# Patient Record
Sex: Female | Born: 1985 | Race: White | Hispanic: No | Marital: Married | Smoking: Never smoker
Health system: Southern US, Community
[De-identification: ages and names within clinical notes are randomized; demographics above are authoritative.]

## PROBLEM LIST (undated history)

## (undated) DIAGNOSIS — A630 Anogenital (venereal) warts: Secondary | ICD-10-CM

## (undated) DIAGNOSIS — K529 Noninfective gastroenteritis and colitis, unspecified: Secondary | ICD-10-CM

## (undated) HISTORY — DX: Anogenital (venereal) warts: A63.0

---

## 2012-03-15 DIAGNOSIS — Z3041 Encounter for surveillance of contraceptive pills: Secondary | ICD-10-CM | POA: Insufficient documentation

## 2012-03-15 DIAGNOSIS — B001 Herpesviral vesicular dermatitis: Secondary | ICD-10-CM | POA: Insufficient documentation

## 2013-02-27 DIAGNOSIS — Z8719 Personal history of other diseases of the digestive system: Secondary | ICD-10-CM | POA: Insufficient documentation

## 2013-05-27 ENCOUNTER — Encounter: Payer: Self-pay | Admitting: Emergency Medicine

## 2013-05-27 ENCOUNTER — Emergency Department
Admission: EM | Admit: 2013-05-27 | Discharge: 2013-05-27 | Disposition: A | Discharge status: Home / Self Care | Payer: 59 | Source: Home / Self Care | Attending: Emergency Medicine | Admitting: Emergency Medicine

## 2013-05-27 DIAGNOSIS — J111 Influenza due to unidentified influenza virus with other respiratory manifestations: Secondary | ICD-10-CM

## 2013-05-27 DIAGNOSIS — H6690 Otitis media, unspecified, unspecified ear: Secondary | ICD-10-CM

## 2013-05-27 DIAGNOSIS — H6691 Otitis media, unspecified, right ear: Secondary | ICD-10-CM

## 2013-05-27 DIAGNOSIS — J09X2 Influenza due to identified novel influenza A virus with other respiratory manifestations: Secondary | ICD-10-CM

## 2013-05-27 HISTORY — DX: Noninfective gastroenteritis and colitis, unspecified: K52.9

## 2013-05-27 LAB — POCT INFLUENZA A/B
Influenza A, POC: POSITIVE
Influenza B, POC: NEGATIVE

## 2013-05-27 LAB — POCT RAPID STREP A (OFFICE): Rapid Strep A Screen: NEGATIVE

## 2013-05-27 MED ORDER — AMOXICILLIN 875 MG PO TABS: 875.0000 mg | ORAL_TABLET | Freq: Two times a day (BID) | ORAL | Status: DC

## 2013-05-27 MED ORDER — OSELTAMIVIR PHOSPHATE 75 MG PO CAPS: 75.0000 mg | ORAL_CAPSULE | Freq: Two times a day (BID) | ORAL | Status: DC

## 2013-05-27 NOTE — ED Provider Notes (Signed)
CSN: [id number]     Arrival date & time 05/27/13  1323 History   First MD Initiated Contact with Patient 05/27/13 1451     Chief Complaint  Patient presents with  . Sore Throat  . Nasal Congestion  . Generalized Body Aches  . Nausea  . Otalgia  . Fever   (Consider location/radiation/quality/duration/timing/severity/associated sxs/prior Treatment) HPI Megan Willis is a 28 y.o. female who complains of onset of cold symptoms for 48+ hours.  The symptoms are constant and mild-moderate in severity. + sore throat + cough No pleuritic pain No wheezing + nasal congestion + post-nasal drainage + sinus pain/pressure No chest congestion No itchy/red eyes + earache No hemoptysis No SOB + chills/sweats + nausea No vomiting No abdominal pain No diarrhea No skin rashes + fatigue + myalgias + headache     Past Medical History  Diagnosis Date  . Colitis    History reviewed. No pertinent past surgical history. Family History  Problem Relation Age of Onset  . Hypertension Father    History  Substance Use Topics  . Smoking status: Never Smoker   . Smokeless tobacco: Not on file  . Alcohol Use: No   OB History   Grav Para Term Preterm Abortions TAB SAB Ect Mult Living                 Review of Systems  All other systems reviewed and are negative.    Allergies  Review of patient's allergies indicates no known allergies.  Home Medications   Current Outpatient Rx  Name  Route  Sig  Dispense  Refill  . norethindrone-ethinyl estradiol (TRIPHASIL,CYCLAFEM,ALYACEN) 0.5/0.75/1-35 MG-MCG tablet   Oral   Take 1 tablet by mouth daily.         Marland Kitchen amoxicillin (AMOXIL) 875 MG tablet   Oral   Take 1 tablet (875 mg total) by mouth 2 (two) times daily.   14 tablet   0   . oseltamivir (TAMIFLU) 75 MG capsule   Oral   Take 1 capsule (75 mg total) by mouth 2 (two) times daily.   10 capsule   0    BP 127/84  Pulse 114  Temp(Src) 97.5 F (36.4 C) (Oral)  Resp 16  Ht 5\' 3"   (1.6 m)  Wt 164 lb (74.39 kg)  BMI 29.06 kg/m2  SpO2 100%  LMP 05/19/2013 Physical Exam  Nursing note and vitals reviewed. Constitutional: She is oriented to person, place, and time. She appears well-developed and well-nourished.  Non-toxic appearance. She does not appear ill.  HENT:  Head: Normocephalic and atraumatic.  Right Ear: External ear and ear canal normal. A middle ear effusion is present.  Left Ear: Tympanic membrane, external ear and ear canal normal.  No middle ear effusion.  Nose: Mucosal edema and rhinorrhea present.  Mouth/Throat: Posterior oropharyngeal erythema present. No oropharyngeal exudate or posterior oropharyngeal edema.  Eyes: No scleral icterus.  Neck: Neck supple.  Cardiovascular: Regular rhythm and normal heart sounds.   Pulmonary/Chest: Effort normal and breath sounds normal. No respiratory distress. She has no decreased breath sounds. She has no wheezes.  Neurological: She is alert and oriented to person, place, and time.  Skin: Skin is warm and dry.  Psychiatric: She has a normal mood and affect. Her speech is normal.    ED Course  Procedures (including critical care time) Labs Review Labs Reviewed  STREP A DNA PROBE  POCT INFLUENZA A/B  POCT RAPID STREP A (OFFICE)   Imaging Review No results  found.   MDM   1. Influenza due to identified novel influenza A virus with other respiratory manifestations   2. Otitis media, right    1)  Take the prescribed antibiotic as instructed for AOM right ear.  Also Rx for Tamiflu if patient chooses to take, however, now at 48 hrs, can wait if she wishes since doesn't appear toxic.  Rapid strep negative.  Rapid flu + for Type A. 2)  Use nasal saline solution (over the counter) at least 3 times a day. 3)  Use over the counter decongestants like Zyrtec-D every 12 hours as needed to help with congestion.  If you have hypertension, do not take medicines with sudafed.  4)  Can take tylenol every 6 hours or motrin  every 8 hours for pain or fever. 5)  Follow up with your primary doctor if no improvement in 5-7 days, sooner if increasing pain, fever, or new symptoms.     Marlaine HindJeffrey H Ramya Vanbergen, MD 05/27/13 [phone]

## 2013-05-27 NOTE — ED Notes (Signed)
Reports 2 day history of sore throat, subjective fever, aches, congestion, cough, ear pain and mild nausea. Took Cold/Flu OTC 0700 today. 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

## 2013-12-18 ENCOUNTER — Encounter: Payer: Self-pay | Admitting: Obstetrics & Gynecology

## 2013-12-18 ENCOUNTER — Ambulatory Visit (INDEPENDENT_AMBULATORY_CARE_PROVIDER_SITE_OTHER): Payer: 59 | Admitting: Obstetrics & Gynecology

## 2013-12-18 VITALS — BP 106/71 | HR 82 | Resp 16 | Ht 64.0 in | Wt 175.0 lb

## 2013-12-18 DIAGNOSIS — A630 Anogenital (venereal) warts: Secondary | ICD-10-CM | POA: Insufficient documentation

## 2013-12-18 MED ORDER — IMIQUIMOD 5 % EX CREA: TOPICAL_CREAM | CUTANEOUS | Status: DC

## 2013-12-18 NOTE — Patient Instructions (Signed)
Genital Warts Genital warts are a sexually transmitted infection. They may appear as small bumps on the tissues of the genital area. CAUSES  Genital warts are caused by a virus called human papillomavirus (HPV). HPV is the most common sexually transmitted disease (STD) and infection of the sex organs. This infection is spread by having unprotected sex with an infected person. It can be spread by vaginal, anal, and oral sex. Many people do not know they are infected. They may be infected for years without problems. However, even if they do not have problems, they can unknowingly pass the infection to their sexual partners. SYMPTOMS   Itching and irritation in the genital area.  Warts that bleed.  Painful sexual intercourse. DIAGNOSIS  Warts are usually recognized with the naked eye on the vagina, vulva, perineum, anus, and rectum. Certain tests can also diagnose genital warts, such as:  A Pap test.  A tissue sample (biopsy) exam.  Colposcopy. A magnifying tool is used to examine the vagina and cervix. The HPV cells will change color when certain solutions are used. TREATMENT  Warts can be removed by:  Applying certain chemicals, such as cantharidin or podophyllin.  Liquid nitrogen freezing (cryotherapy).  Immunotherapy with Candida or Trichophyton injections.  Laser treatment.  Burning with an electrified probe (electrocautery).  Interferon injections.  Surgery. PREVENTION  HPV vaccination can help prevent HPV infections that cause genital warts and that cause cancer of the cervix. It is recommended that the vaccination be given to people between the ages 9 to 26 years old. The vaccine might not work as well or might not work at all if you already have HPV. It should not be given to pregnant women. HOME CARE INSTRUCTIONS   It is important to follow your caregiver's instructions. The warts will not go away without treatment. Repeat treatments are often needed to get rid of warts.  Even after it appears that the warts are gone, the normal tissue underneath often remains infected.  Do not try to treat genital warts with medicine used to treat hand warts. This type of medicine is strong and can burn the skin in the genital area, causing more damage.  Tell your past and current sexual partner(s) that you have genital warts. They may be infected also and need treatment.  Avoid sexual contact while being treated.  Do not touch or scratch the warts. The infection may spread to other parts of your body.  Women with genital warts should have a cervical cancer check (Pap test) at least once a year. This type of cancer is slow-growing and can be cured if found early. Chances of developing cervical cancer are increased with HPV.  Inform your obstetrician about your warts in the event of pregnancy. This virus can be passed to the baby's respiratory tract. Discuss this with your caregiver.  Use a condom during sexual intercourse. Following treatment, the use of condoms will help prevent reinfection.  Ask your caregiver about using over-the-counter anti-itch creams. SEEK MEDICAL CARE IF:   Your treated skin becomes red, swollen, or painful.  You have a fever.  You feel generally ill.  You feel little lumps in and around your genital area.  You are bleeding or have painful sexual intercourse. MAKE SURE YOU:   Understand these instructions.  Will watch your condition.  Will get help right away if you are not doing well or get worse. Document Released: 03/12/2000 Document Revised: 07/30/2013 Document Reviewed: 09/21/2010 ExitCare Patient Information 2015 [employer]. This   information is not intended to replace advice given to you by your health care provider. Make sure you discuss any questions you have with your health care provider.   Imiquimod skin cream What is this medicine? IMIQUIMOD (i mi KWI mod) cream is used to treat external genital or anal warts. It is  also used to treat other skin conditions such as actinic keratosis and certain types of skin cancer. This medicine may be used for other purposes; ask your health care provider or pharmacist if you have questions. COMMON BRAND NAME(S): Celesta Aver What should I tell my health care provider before I take this medicine? They need to know if you have any of these conditions: -decreased immune function -an unusual or allergic reaction to imiquimod, other medicines, foods, dyes, or preservatives -pregnant or trying to get pregnant -breast-feeding How should I use this medicine? This medicine is for external use only. Do not take by mouth. Follow the directions on the prescription label. Apply just before bedtime. Wash your hands before and after use. Apply a thin layer of cream and massage gently into the affected areas until no longer visible. Do not use in the mouth, eyes or the vagina. Use this medicine only on the affected area as directed by your health care provider. Do not use for longer than prescribed. It is important not to use more medicine than prescribed. To do so may increase the chance of side effects. Talk to your pediatrician regarding the use of this medicine in children. While this drug may be prescribed for children as young as 110 years of age for selected conditions, precautions do apply. Overdosage: If you think you have taken too much of this medicine contact a poison control center or emergency room at once. NOTE: This medicine is only for you. Do not share this medicine with others. What if I miss a dose? If you miss a dose, use it as soon as you can. If it is almost time for your next dose, use only that dose. Do not use double or extra doses. What may interact with this medicine? Interactions are not expected. Do not use any other medicines on the treated area without asking your doctor or health care professional. This list may not describe all possible interactions. Give  your health care provider a list of all the medicines, herbs, non-prescription drugs, or dietary supplements you use. Also tell them if you smoke, drink alcohol, or use illegal drugs. Some items may interact with your medicine. What should I watch for while using this medicine? Visit your health care professional for regular checks on your progress. Do not use this medicine until the skin has healed from any other drug (example: podofilox or podophyllin resin) or surgical skin treatment. Females should receive regular pelvic exams while being treated for genital warts. Most patients see improvement within 4 weeks. It may take up to 16 weeks to see a full clearing of the warts. This medicine is not a cure. New warts may develop during or after treatment. Avoid sexual (genital, anal, oral) contact while the cream is on the skin. If warts are visible in the genital area, sexual contact should be avoided until the warts are treated. The use of latex condoms during sexual contact may reduce, but not entirely prevent, infecting others. This medicine may weaken condoms, diaphragms, cervical caps or other barrier devices and make them less effective as birth control. Do not cover the treated area with an airtight bandage. Cotton gauze  dressings can be used. Cotton underwear can be worn after using this medicine on the genital or anal area. Actinic keratoses that were not seen before may appear during treatment and may later go away. The treatment area and surrounding area may lighten or darken after treatment with this medicine. These skin color changes may be permanent in some patients. If you experience a skin reaction at the treatment site that interferes or prevents you from doing any daily activity, contact your health care provider. You may need a rest period from treatment. Treatment may be restarted once the reaction has gotten better as recommended by your doctor or health care professional. This medicine  can make you more sensitive to the sun. Keep out of the sun. If you cannot avoid being in the sun, wear protective clothing and use sunscreen. Do not use sun lamps or tanning beds/booths. What side effects may I notice from receiving this medicine? Side effects that you should report to your doctor or health care professional as soon as possible: -open sores with or without drainage -skin infection -skin rash -unusual or severe skin reaction Side effects that usually do not require medical attention (report to your doctor or health care professional if they continue or are bothersome): -burning or itching -redness of the skin (very common but is usually not painful or harmful) -scabbing, crusting, or peeling skin -skin that becomes hard or thickened -swelling of the skin This list may not describe all possible side effects. Call your doctor for medical advice about side effects. You may report side effects to FDA at 1-800-FDA-1088. Where should I keep my medicine? Keep out of the reach of children. Store between 4 and 25 degrees C (39 and 77 degrees F). Do not freeze. Throw away any unused medicine after the expiration date. Discard packet after applying to affected area. Partial packets should not be saved or reused. NOTE: This sheet is a summary. It may not cover all possible information. If you have questions about this medicine, talk to your doctor, pharmacist, or health care provider.  2015, Elsevier/Gold Standard. (2008-02-27 10:33:25)

## 2013-12-18 NOTE — Progress Notes (Signed)
   CLINIC ENCOUNTER NOTE  History:  28 y.o. G0 here today for evaluation and management of genital warts. She has received Gardasil series.   The following portions of the patient's history were reviewed and updated as appropriate: allergies, current medications, past family history, past medical history, past social history, past surgical history and problem list. She had a normal pap smear 1.5 years ago, no history of cervical dysplasia.  Review of Systems:  Pertinent items are noted in HPI.  Objective:  Physical Exam BP 106/71  Pulse 82  Resp 16  Ht  (1.626 m)  Wt 175 lb (79.379 kg)  BMI 30.02 kg/m2  LMP 11/26/2013 Gen: NAD Abd: Soft, nontender and nondistended Pelvic: One 5 mm raised lesion noted in the posterior fourchette, some flat lesions also noted.   No erythema.   Normal appearing external genitalia; normal appearing vaginal mucosa and cervix.  Normal discharge.  Small uterus, no other palpable masses, no uterine or adnexal tenderness   Assessment & Plan:  Counseled about different options for treatment: TCA, Aldara, cryotherapy.  Details of these modalities discussed; all questions answered. Patient desires Aldara, this was prescribed for patient. Will monitor response.  Routine preventative health maintenance measures emphasized.    Jaynie Collins, MD, FACOG Attending Obstetrician & Gynecologist Center for [employer], [facility]

## 2015-07-28 ENCOUNTER — Encounter: Payer: Self-pay | Admitting: Sports Medicine

## 2015-07-28 ENCOUNTER — Ambulatory Visit (INDEPENDENT_AMBULATORY_CARE_PROVIDER_SITE_OTHER): Payer: BLUE CROSS/BLUE SHIELD | Admitting: Sports Medicine

## 2015-07-28 VITALS — BP 102/63 | HR 81 | Resp 18 | Wt 207.0 lb

## 2015-07-28 DIAGNOSIS — M722 Plantar fascial fibromatosis: Secondary | ICD-10-CM | POA: Diagnosis not present

## 2015-07-28 MED ORDER — MELOXICAM 15 MG PO TABS: ORAL_TABLET | ORAL | Status: DC

## 2015-07-28 NOTE — Progress Notes (Signed)
   Subjective:    I'm seeing this patient as a consultation for:   Dr. Asencion Partridgeamille Andy  CC:  Bilateral heel pain  HPI: For months this pleasant 30 year old female  Has had pain that she localizes on the plantar aspect of both heels, left worse than right, worse with the first few steps in the morning. Pain is moderate, persistent without radiation.  Past medical history, Surgical history, Family history not pertinant except as noted below, Social history, Allergies, and medications have been entered into the medical record, reviewed, and no changes needed.   Review of Systems: No headache, visual changes, nausea, vomiting, diarrhea, constipation, dizziness, abdominal pain, skin rash, fevers, chills, night sweats, weight loss, swollen lymph nodes, body aches, joint swelling, muscle aches, chest pain, shortness of breath, mood changes, visual or auditory hallucinations.   Objective:   General: Well Developed, well nourished, and in no acute distress.  Neuro/Psych: Alert and oriented x3, extra-ocular muscles intact, able to move all 4 extremities, sensation grossly intact. Skin: Warm and dry, no rashes noted.  Respiratory: Not using accessory muscles, speaking in full sentences, trachea midline.  Cardiovascular: Pulses palpable, no extremity edema. Abdomen: Does not appear distended.  Bilateral feet: No visible erythema or swelling. Range of motion is full in all directions. Strength is 5/5 in all directions. No hallux valgus. No pes cavus or pes planus. No abnormal callus noted. No pain over the navicular prominence, or base of fifth metatarsal.  mild tenderness to palpation of the calcaneal insertion of plantar fascia. No pain at the Achilles insertion. No pain over the calcaneal bursa. No pain of the retrocalcaneal bursa. No tenderness to palpation over the tarsals, metatarsals, or phalanges. No hallux rigidus or limitus. No tenderness palpation over interphalangeal joints. No pain  with compression of the metatarsal heads. Neurovascularly intact distally.  Impression and Recommendations:   This case required medical decision making of moderate complexity.

## 2015-07-28 NOTE — Assessment & Plan Note (Signed)
Return for custom orthotics, adding meloxicam, rehabilitation exercises, avoidance of barefoot walking.

## 2015-08-05 ENCOUNTER — Ambulatory Visit (INDEPENDENT_AMBULATORY_CARE_PROVIDER_SITE_OTHER): Payer: BLUE CROSS/BLUE SHIELD | Admitting: Sports Medicine

## 2015-08-05 ENCOUNTER — Encounter: Payer: Self-pay | Admitting: Sports Medicine

## 2015-08-05 VITALS — BP 110/73 | HR 101 | Resp 18 | Wt 210.5 lb

## 2015-08-05 DIAGNOSIS — M722 Plantar fascial fibromatosis: Secondary | ICD-10-CM

## 2015-08-05 NOTE — Progress Notes (Signed)

## 2015-08-05 NOTE — Assessment & Plan Note (Signed)
Custom orthotics as above, return in one month. 

## 2015-09-02 ENCOUNTER — Ambulatory Visit (INDEPENDENT_AMBULATORY_CARE_PROVIDER_SITE_OTHER): Payer: BLUE CROSS/BLUE SHIELD | Admitting: Sports Medicine

## 2015-09-02 ENCOUNTER — Encounter: Payer: Self-pay | Admitting: Sports Medicine

## 2015-09-02 VITALS — BP 122/88 | HR 96 | Resp 18 | Wt 208.1 lb

## 2015-09-02 DIAGNOSIS — M722 Plantar fascial fibromatosis: Secondary | ICD-10-CM

## 2015-09-02 NOTE — Assessment & Plan Note (Signed)
Resolved with conservative measures, return as needed. 

## 2015-09-02 NOTE — Progress Notes (Signed)
  Subjective:    CC: Follow-up  HPI: Plantar fasciitis: Resolved with orthotics and rehabilitation exercises and meloxicam.  Past medical history, Surgical history, Family history not pertinant except as noted below, Social history, Allergies, and medications have been entered into the medical record, reviewed, and no changes needed.   Review of Systems: No fevers, chills, night sweats, weight loss, chest pain, or shortness of breath.   Objective:    General: Well Developed, well nourished, and in no acute distress.  Neuro: Alert and oriented x3, extra-ocular muscles intact, sensation grossly intact.  HEENT: Normocephalic, atraumatic, pupils equal round reactive to light, neck supple, no masses, no lymphadenopathy, thyroid nonpalpable.  Skin: Warm and dry, no rashes. Cardiac: Regular rate and rhythm, no murmurs rubs or gallops, no lower extremity edema.  Respiratory: Clear to auscultation bilaterally. Not using accessory muscles, speaking in full sentences.  Impression and Recommendations:

## 2015-09-18 DIAGNOSIS — M545 Low back pain, unspecified: Secondary | ICD-10-CM | POA: Insufficient documentation

## 2015-09-18 DIAGNOSIS — G8929 Other chronic pain: Secondary | ICD-10-CM | POA: Insufficient documentation

## 2018-07-19 ENCOUNTER — Ambulatory Visit (INDEPENDENT_AMBULATORY_CARE_PROVIDER_SITE_OTHER): Payer: 59 | Admitting: Sports Medicine

## 2018-07-19 ENCOUNTER — Encounter: Payer: Self-pay | Admitting: Sports Medicine

## 2018-07-19 DIAGNOSIS — S86892A Other injury of other muscle(s) and tendon(s) at lower leg level, left leg, initial encounter: Secondary | ICD-10-CM

## 2018-07-19 DIAGNOSIS — M2580 Other specified joint disorders, unspecified joint: Secondary | ICD-10-CM | POA: Diagnosis not present

## 2018-07-19 DIAGNOSIS — M7061 Trochanteric bursitis, right hip: Secondary | ICD-10-CM

## 2018-07-19 MED ORDER — CALCIUM CARBONATE-VITAMIN D 600-400 MG-UNIT PO TABS: 1.0000 | ORAL_TABLET | Freq: Two times a day (BID) | ORAL | 11 refills | Status: DC

## 2018-07-19 MED ORDER — MELOXICAM 15 MG PO TABS: ORAL_TABLET | ORAL | 3 refills | Status: DC

## 2018-07-19 NOTE — Assessment & Plan Note (Signed)
Meloxicam, dancers pad placed in right orthotic. Rehab exercises given. She will cut mileage in half for a week and then restart. Return to see me in 1 month.

## 2018-07-19 NOTE — Assessment & Plan Note (Signed)
Nexplanon in place, painful, desires removal. This was removed today, closed with Dermabond, she can return to see me as needed, she will also touch base with her PCP to discuss another form of contraception. I really do not think she is getting need any prescription strength postoperative pain relief, she can use ibuprofen or Tylenol.

## 2018-07-19 NOTE — Assessment & Plan Note (Signed)
Weak hip abductors, exercises given to strengthen this. Adding meloxicam and a calcium/vitamin D supplement. Return to see me in 1 month, we will retest hip abductor strength.

## 2018-07-19 NOTE — Patient Instructions (Signed)
Medial tibial stress syndrome, sesamoiditis, and greater trochanteric bursitis. Dancers pad placed in the right orthotic. Adding meloxicam daily to calm things down, as well as a calcium and vitamin D supplement to use twice a day. Cut mileage in half for 1 week and then may increase mileage again after that.  Hip Rehabilitation Protocol:  1.  Side leg raises.  3x30 with no weight, then 3x15 with 2 lb ankle weight, then 3x15 with 5 lb ankle weight 2.  Standing hip rotation.  3x30 with no weight, then 3x15 with 2 lb ankle weight, then 3x15 with 5 lb ankle weight. 3.  Side step ups.  3x30 with no weight, then 3x15 with 5 lbs in backpack, then 3x15 with 10 lbs in backpack.

## 2018-07-19 NOTE — Progress Notes (Signed)
Subjective:    CC: Multiple pains  HPI: This is a pleasant 33 year old female, she has recently started running for exercise and to lose weight.  Currently doing a couch to 5K program and at about 15 miles per week.  She did recently switch to new shoes.  Unfortunately she has started to develop pain in the lateral right hip, anteromedial left shin worse than right shin, as well as on the plantar aspect of the right first MTP.  Symptoms are moderate, worsening, localized without radiation.  No trauma.  No constitutional symptoms.  I reviewed the past medical history, family history, social history, surgical history, and allergies today and no changes were needed.  Please see the problem list section below in epic for further details.  Past Medical History: Past Medical History:  Diagnosis Date  . Colitis   . Genital warts    Past Surgical History: No past surgical history on file. Social History: Social History   Socioeconomic History  . Marital status: Single    Spouse name: Not on file  . Number of children: Not on file  . Years of education: Not on file  . Highest education level: Not on file  Occupational History  . Not on file  Social Needs  . Financial resource strain: Not on file  . Food insecurity:    Worry: Not on file    Inability: Not on file  . Transportation needs:    Medical: Not on file    Non-medical: Not on file  Tobacco Use  . Smoking status: Never Smoker  . Smokeless tobacco: Never Used  Substance and Sexual Activity  . Alcohol use: No  . Drug use: No  . Sexual activity: Yes    Partners: Male    Birth control/protection: Pill  Lifestyle  . Physical activity:    Days per week: Not on file    Minutes per session: Not on file  . Stress: Not on file  Relationships  . Social connections:    Talks on phone: Not on file    Gets together: Not on file    Attends religious service: Not on file    Active member of club or organization: Not on file   Attends meetings of clubs or organizations: Not on file    Relationship status: Not on file  Other Topics Concern  . Not on file  Social History Narrative  . Not on file   Family History: Family History  Problem Relation Age of Onset  . Hypertension Father   . Stroke Maternal Grandfather   . Heart disease Maternal Grandfather    Allergies: No Known Allergies Medications: See med rec.  Review of Systems: No fevers, chills, night sweats, weight loss, chest pain, or shortness of breath.   Objective:    General: Well Developed, well nourished, and in no acute distress.  Neuro: Alert and oriented x3, extra-ocular muscles intact, sensation grossly intact.  HEENT: Normocephalic, atraumatic, pupils equal round reactive to light, neck supple, no masses, no lymphadenopathy, thyroid nonpalpable.  Skin: Warm and dry, no rashes. Cardiac: Regular rate and rhythm, no murmurs rubs or gallops, no lower extremity edema.  Respiratory: Clear to auscultation bilaterally. Not using accessory muscles, speaking in full sentences. Right foot: No visible erythema or swelling. Range of motion is full in all directions. Strength is 5/5 in all directions. No hallux valgus. No pes cavus or pes planus. No abnormal callus noted. No pain over the navicular prominence, or base of fifth metatarsal.  No tenderness to palpation of the calcaneal insertion of plantar fascia. No pain at the Achilles insertion. No pain over the calcaneal bursa. No pain of the retrocalcaneal bursa. No tenderness to palpation over the tarsals, metatarsals, or phalanges. No hallux rigidus or limitus. No tenderness palpation over interphalangeal joints. No pain with compression of the metatarsal heads. Neurovascularly intact distally. Tender to palpation along the medial sesamoid. Left shin: There is a broad-based area of tenderness to palpation along the medial border, no palpable bony callus or discrete areas of tenderness.  Right hip: ROM IR: 60 Deg, ER: 60 Deg, Flexion: 120 Deg, Extension: 100 Deg, Abduction: 45 Deg, Adduction: 45 Deg Strength IR: 5/5, ER: 5/5, Flexion: 5/5, Extension: 5/5, Abduction: 5/5, Adduction: 5/5 Pelvic alignment unremarkable to inspection and palpation. Standing hip rotation and gait without trendelenburg / unsteadiness. Greater trochanter with tenderness to palpation. No tenderness over piriformis. No SI joint tenderness and normal minimal SI movement. Hip abductor strength is objectively weak bilaterally.  Impression and Recommendations:    Left medial tibial stress syndrome (Shin Splints) Weak hip abductors, exercises given to strengthen this. Adding meloxicam and a calcium/vitamin D supplement. Return to see me in 1 month, we will retest hip abductor strength.  Trochanteric bursitis, right hip Weak hip abductors, exercises given to strengthen this. Adding meloxicam and a calcium/vitamin D supplement. Return to see me in 1 month, we will retest hip abductor strength.  Sesamoiditis Meloxicam, dancers pad placed in right orthotic. Rehab exercises given. She will cut mileage in half for a week and then restart. Return to see me in 1 month.  ___________________________________________ Ihor Austinhomas J. Benjamin Stainhekkekandam, M.D., ABFM., CAQSM. Primary Care and Sports Medicine [facility]  Adjunct Professor of Family Medicine  [employer]

## 2018-07-19 NOTE — Assessment & Plan Note (Signed)
Weak hip abductors, exercises given to strengthen this. Adding meloxicam and a calcium/vitamin D supplement. Return to see me in 1 month, we will retest hip abductor strength. 

## 2018-08-16 ENCOUNTER — Encounter: Payer: Self-pay | Admitting: Sports Medicine

## 2018-08-16 ENCOUNTER — Ambulatory Visit (INDEPENDENT_AMBULATORY_CARE_PROVIDER_SITE_OTHER): Payer: 59 | Admitting: Sports Medicine

## 2018-08-16 DIAGNOSIS — S86892D Other injury of other muscle(s) and tendon(s) at lower leg level, left leg, subsequent encounter: Secondary | ICD-10-CM

## 2018-08-16 DIAGNOSIS — J301 Allergic rhinitis due to pollen: Secondary | ICD-10-CM

## 2018-08-16 DIAGNOSIS — M722 Plantar fascial fibromatosis: Secondary | ICD-10-CM | POA: Diagnosis not present

## 2018-08-16 DIAGNOSIS — M7061 Trochanteric bursitis, right hip: Secondary | ICD-10-CM

## 2018-08-16 DIAGNOSIS — J309 Allergic rhinitis, unspecified: Secondary | ICD-10-CM | POA: Insufficient documentation

## 2018-08-16 MED ORDER — LEVOCETIRIZINE DIHYDROCHLORIDE 5 MG PO TABS
5.0000 mg | ORAL_TABLET | Freq: Every evening | ORAL | 3 refills | Status: DC
Start: 2018-08-16 — End: 2018-09-13

## 2018-08-16 MED ORDER — TRIAMCINOLONE ACETONIDE 55 MCG/ACT NA AERO: 2.0000 | INHALATION_SPRAY | Freq: Every day | NASAL | 11 refills | Status: AC

## 2018-08-16 NOTE — Progress Notes (Signed)
Subjective:    CC: Multiple issues  HPI: This is a pleasant 33 year old female, we treated her for medial tibial stress syndrome, trochanteric bursitis, sesamoiditis, her hip abductor's were very weak on the right, she has been working to strengthen them and her symptoms have improved to some degree, she still has significant discomfort when going to 21+ minutes of running, this is after coming off of the meloxicam.  She feels as though she may have advanced her distance a bit too quickly.  In addition she has seasonal allergies, intolerable runny nose, congestion, did not respond to Allegra, Zyrtec, Claritin, nasal azelastine.  I reviewed the past medical history, family history, social history, surgical history, and allergies today and no changes were needed.  Please see the problem list section below in epic for further details.  Past Medical History: Past Medical History:  Diagnosis Date  . Colitis   . Genital warts    Past Surgical History: No past surgical history on file. Social History: Social History   Socioeconomic History  . Marital status: Single    Spouse name: Not on file  . Number of children: Not on file  . Years of education: Not on file  . Highest education level: Not on file  Occupational History  . Not on file  Social Needs  . Financial resource strain: Not on file  . Food insecurity:    Worry: Not on file    Inability: Not on file  . Transportation needs:    Medical: Not on file    Non-medical: Not on file  Tobacco Use  . Smoking status: Never Smoker  . Smokeless tobacco: Never Used  Substance and Sexual Activity  . Alcohol use: No  . Drug use: No  . Sexual activity: Yes    Partners: Male    Birth control/protection: Pill  Lifestyle  . Physical activity:    Days per week: Not on file    Minutes per session: Not on file  . Stress: Not on file  Relationships  . Social connections:    Talks on phone: Not on file    Gets together: Not on file     Attends religious service: Not on file    Active member of club or organization: Not on file    Attends meetings of clubs or organizations: Not on file    Relationship status: Not on file  Other Topics Concern  . Not on file  Social History Narrative  . Not on file   Family History: Family History  Problem Relation Age of Onset  . Hypertension Father   . Stroke Maternal Grandfather   . Heart disease Maternal Grandfather    Allergies: No Known Allergies Medications: See med rec.  Review of Systems: No fevers, chills, night sweats, weight loss, chest pain, or shortness of breath.   Objective:    General: Well Developed, well nourished, and in no acute distress.  Neuro: Alert and oriented x3, extra-ocular muscles intact, sensation grossly intact.  HEENT: Normocephalic, atraumatic, pupils equal round reactive to light, neck supple, no masses, no lymphadenopathy, thyroid nonpalpable.  Skin: Warm and dry, no rashes. Cardiac: Regular rate and rhythm, no murmurs rubs or gallops, no lower extremity edema.  Respiratory: Clear to auscultation bilaterally. Not using accessory muscles, speaking in full sentences. Right hip: Hip abductor's are improved but still weak compared to the left.  Impression and Recommendations:    Left medial tibial stress syndrome (Shin Splints) Having good improvements, does need to work  a little more in the rehab exercises, she is not on the hip abductor exercises with a weight just yet. She will also restart the meloxicam and do a slower up titration of her distance. Return in a month.  Plantar fasciitis, bilateral Having good improvements, does need to work a little more in the rehab exercises, she is not on the hip abductor exercises with a weight just yet. She will also restart the meloxicam and do a slower up titration of her distance. Return in a month.  Trochanteric bursitis, right hip Having good improvements, does need to work a little more in  the rehab exercises, she is not on the hip abductor exercises with a weight just yet. She will also restart the meloxicam and do a slower up titration of her distance. Return in a month.  Allergic rhinitis Has not had success with Claritin, Allegra, Zyrtec. Nasal azelastine was also not very effective. Switching to Nasacort, Xyzal. Return in a month for this.   ___________________________________________ Ihor Austin. Benjamin Stain, M.D., ABFM., CAQSM. Primary Care and Sports Medicine [facility]  Adjunct Professor of Family Medicine  [employer]

## 2018-08-16 NOTE — Assessment & Plan Note (Signed)
Having good improvements, does need to work a little more in the rehab exercises, she is not on the hip abductor exercises with a weight just yet. She will also restart the meloxicam and do a slower up titration of her distance. Return in a month.

## 2018-08-16 NOTE — Assessment & Plan Note (Signed)
Having good improvements, does need to work a little more in the rehab exercises, she is not on the hip abductor exercises with a weight just yet. She will also restart the meloxicam and do a slower up titration of her distance. Return in a month. 

## 2018-08-16 NOTE — Assessment & Plan Note (Signed)
Has not had success with Claritin, Allegra, Zyrtec. Nasal azelastine was also not very effective. Switching to Nasacort, Xyzal. Return in a month for this.

## 2018-09-13 ENCOUNTER — Other Ambulatory Visit: Payer: Self-pay

## 2018-09-13 ENCOUNTER — Ambulatory Visit (INDEPENDENT_AMBULATORY_CARE_PROVIDER_SITE_OTHER): Payer: 59

## 2018-09-13 ENCOUNTER — Encounter: Payer: Self-pay | Admitting: Sports Medicine

## 2018-09-13 ENCOUNTER — Ambulatory Visit (INDEPENDENT_AMBULATORY_CARE_PROVIDER_SITE_OTHER): Payer: 59 | Admitting: Sports Medicine

## 2018-09-13 DIAGNOSIS — M7061 Trochanteric bursitis, right hip: Secondary | ICD-10-CM | POA: Diagnosis not present

## 2018-09-13 DIAGNOSIS — S86892D Other injury of other muscle(s) and tendon(s) at lower leg level, left leg, subsequent encounter: Secondary | ICD-10-CM | POA: Diagnosis not present

## 2018-09-13 DIAGNOSIS — M79674 Pain in right toe(s): Secondary | ICD-10-CM

## 2018-09-13 NOTE — Progress Notes (Signed)
Subjective:    CC: Follow-up  HPI: Megan Willis is a pleasant 33 year old female, we have been treating her for trochanteric bursitis, medial tibial stress syndrome, and sesamoiditis.  Her hip abductor strength improved with home rehab exercises and her MTSS and trochanteric bursitis resolved, continues to have pain along the first ray, initially at the medial sesamoid but now radiating along the dorsomedial first MTP.  Mild, persistent, localized without radiation proximally.  I reviewed the past medical history, family history, social history, surgical history, and allergies today and no changes were needed.  Please see the problem list section below in epic for further details.  Past Medical History: Past Medical History:  Diagnosis Date  . Colitis   . Genital warts    Past Surgical History: No past surgical history on file. Social History: Social History   Socioeconomic History  . Marital status: Single    Spouse name: Not on file  . Number of children: Not on file  . Years of education: Not on file  . Highest education level: Not on file  Occupational History  . Not on file  Social Needs  . Financial resource strain: Not on file  . Food insecurity    Worry: Not on file    Inability: Not on file  . Transportation needs    Medical: Not on file    Non-medical: Not on file  Tobacco Use  . Smoking status: Never Smoker  . Smokeless tobacco: Never Used  Substance and Sexual Activity  . Alcohol use: No  . Drug use: No  . Sexual activity: Yes    Partners: Male    Birth control/protection: Pill  Lifestyle  . Physical activity    Days per week: Not on file    Minutes per session: Not on file  . Stress: Not on file  Relationships  . Social Herbalist on phone: Not on file    Gets together: Not on file    Attends religious service: Not on file    Active member of club or organization: Not on file    Attends meetings of clubs or organizations: Not on file   Relationship status: Not on file  Other Topics Concern  . Not on file  Social History Narrative  . Not on file   Family History: Family History  Problem Relation Age of Onset  . Hypertension Father   . Stroke Maternal Grandfather   . Heart disease Maternal Grandfather    Allergies: No Known Allergies Medications: See med rec.  Review of Systems: No fevers, chills, night sweats, weight loss, chest pain, or shortness of breath.   Objective:    General: Well Developed, well nourished, and in no acute distress.  Neuro: Alert and oriented x3, extra-ocular muscles intact, sensation grossly intact.  HEENT: Normocephalic, atraumatic, pupils equal round reactive to light, neck supple, no masses, no lymphadenopathy, thyroid nonpalpable.  Skin: Warm and dry, no rashes. Cardiac: Regular rate and rhythm, no murmurs rubs or gallops, no lower extremity edema.  Respiratory: Clear to auscultation bilaterally. Not using accessory muscles, speaking in full sentences. Right foot: No visible erythema or swelling. Range of motion is full in all directions. Strength is 5/5 in all directions. No hallux valgus. No pes cavus or pes planus. No abnormal callus noted. No pain over the navicular prominence, or base of fifth metatarsal. No tenderness to palpation of the calcaneal insertion of plantar fascia. No pain at the Achilles insertion. No pain over the calcaneal bursa.  No pain of the retrocalcaneal bursa. No tenderness to palpation over the tarsals, metatarsals, or phalanges. No hallux rigidus or limitus. No tenderness palpation over interphalangeal joints. No pain with compression of the metatarsal heads. Neurovascularly intact distally. Hip abductors now strong and symmetric.  Impression and Recommendations:    Pain of great toe, right Initially suspected to be medial sesamoiditis. No better with dancers pad. We switched to a first metatarsal ray post, and she will see how this goes over  the next month. She can follow-up in a virtual visit.  Left medial tibial stress syndrome (Shin Splints) Resolved with hip abductor strengthening.  Trochanteric bursitis, right hip Resolved with hip abductor strengthening, strength is now symmetric.   ___________________________________________ Ihor Austinhomas J. Benjamin Stainhekkekandam, M.D., ABFM., CAQSM. Primary Care and Sports Medicine [facility]  Adjunct Professor of Family Medicine  [employer]

## 2018-09-13 NOTE — Assessment & Plan Note (Signed)
Resolved with hip abductor strengthening, strength is now symmetric.

## 2018-09-13 NOTE — Assessment & Plan Note (Signed)
Resolved with hip abductor strengthening.

## 2018-09-13 NOTE — Assessment & Plan Note (Signed)
Initially suspected to be medial sesamoiditis. No better with dancers pad. We switched to a first metatarsal ray post, and she will see how this goes over the next month. She can follow-up in a virtual visit.

## 2018-09-26 ENCOUNTER — Encounter: Payer: Self-pay | Admitting: Sports Medicine

## 2018-10-19 ENCOUNTER — Ambulatory Visit (INDEPENDENT_AMBULATORY_CARE_PROVIDER_SITE_OTHER): Payer: 59 | Admitting: Sports Medicine

## 2018-10-19 ENCOUNTER — Encounter: Payer: Self-pay | Admitting: Sports Medicine

## 2018-10-19 ENCOUNTER — Telehealth: Payer: Self-pay | Admitting: Sports Medicine

## 2018-10-19 DIAGNOSIS — M79674 Pain in right toe(s): Secondary | ICD-10-CM

## 2018-10-19 NOTE — Telephone Encounter (Signed)
Called [employer] for prior auth of MR right foot without contrast. Case # [id number]. Prior auth obtained with authorization number as follows [id number] good from 11/19/18 to 04/17/19. Herbie Drape at [facility] in [place] and given Josem Kaufmann #. Place on Vivia Ewing desk to enter into work que. KG LPN

## 2018-10-19 NOTE — Assessment & Plan Note (Signed)
Continues to have pain on the plantar great toe, initially suspected to be medial sesamoiditis, did not get better with dancers pads, first metatarsal ray posting, at this point I think we need an MRI, she has failed greater than 6 weeks of physician directed conservative measures with unrevealing x-rays.

## 2018-10-19 NOTE — Progress Notes (Signed)
Virtual Visit via WebEx/MyChart   I connected with  Megan Willis  on 10/19/18 via WebEx/MyChart/Doximity Video and verified that I am speaking with the correct person using two identifiers.   I discussed the limitations, risks, security and privacy concerns of performing an evaluation and management service by WebEx/MyChart/Doximity Video, including the higher likelihood of inaccurate diagnosis and treatment, and the availability of in person appointments.  We also discussed the likely need of an additional face to face encounter for complete and high quality delivery of care.  I also discussed with the patient that there may be a patient responsible charge related to this service. The patient expressed understanding and wishes to proceed.  Provider location is either at home or medical facility. Patient location is at their home, different from provider location. People involved in care of the patient during this telehealth encounter were myself, my nurse/medical assistant, and my front office/scheduling team member.  Subjective:    CC: Follow-up  HPI: This is a pleasant 33 year old female, we have been treating her for a sesamoiditis, we have tried dancers pads, metatarsal pads, activity modification, custom orthotics without success.  Symptoms are moderate, persistent.  X-rays were unrevealing, she has failed greater than 6 weeks of physician directed conservative measures.  I reviewed the past medical history, family history, social history, surgical history, and allergies today and no changes were needed.  Please see the problem list section below in epic for further details.  Past Medical History: Past Medical History:  Diagnosis Date  . Colitis   . Genital warts    Past Surgical History: No past surgical history on file. Social History: Social History   Socioeconomic History  . Marital status: Single    Spouse name: Not on file  . Number of children: Not on file  . Years  of education: Not on file  . Highest education level: Not on file  Occupational History  . Not on file  Social Needs  . Financial resource strain: Not on file  . Food insecurity    Worry: Not on file    Inability: Not on file  . Transportation needs    Medical: Not on file    Non-medical: Not on file  Tobacco Use  . Smoking status: Never Smoker  . Smokeless tobacco: Never Used  Substance and Sexual Activity  . Alcohol use: No  . Drug use: No  . Sexual activity: Yes    Partners: Male    Birth control/protection: Pill  Lifestyle  . Physical activity    Days per week: Not on file    Minutes per session: Not on file  . Stress: Not on file  Relationships  . Social Herbalist on phone: Not on file    Gets together: Not on file    Attends religious service: Not on file    Active member of club or organization: Not on file    Attends meetings of clubs or organizations: Not on file    Relationship status: Not on file  Other Topics Concern  . Not on file  Social History Narrative  . Not on file   Family History: Family History  Problem Relation Age of Onset  . Hypertension Father   . Stroke Maternal Grandfather   . Heart disease Maternal Grandfather    Allergies: No Known Allergies Medications: See med rec.  Review of Systems: No fevers, chills, night sweats, weight loss, chest pain, or shortness of breath.   Objective:  General: Speaking full sentences, no audible heavy breathing.  Sounds alert and appropriately interactive.  Appears well.  Face symmetric.  Extraocular movements intact.  Pupils equal and round.  No nasal flaring or accessory muscle use visualized.  No other physical exam performed due to the non-physical nature of this visit.  Impression and Recommendations:    Pain of great toe, right Continues to have pain on the plantar great toe, initially suspected to be medial sesamoiditis, did not get better with dancers pads, first metatarsal ray  posting, at this point I think we need an MRI, she has failed greater than 6 weeks of physician directed conservative measures with unrevealing x-rays.  I discussed the above assessment and treatment plan with the patient. The patient was provided an opportunity to ask questions and all were answered. The patient agreed with the plan and demonstrated an understanding of the instructions.   The patient was advised to call back or seek an in-person evaluation if the symptoms worsen or if the condition fails to improve as anticipated.   I provided 25 minutes of non-face-to-face time during this encounter, 15 minutes of additional time was needed to gather information, review chart, records, communicate/coordinate with staff remotely, troubleshooting the multiple errors that we get every time when trying to do video calls through the electronic medical record, WebEx, and Doximity, restart the encounter multiple times due to instability of the software, as well as complete documentation.   ___________________________________________ Ihor Austinhomas J. Benjamin Stainhekkekandam, M.D., ABFM., CAQSM. Primary Care and Sports Medicine [facility]  Adjunct Professor of Family Medicine  [employer]

## 2018-10-22 ENCOUNTER — Ambulatory Visit (INDEPENDENT_AMBULATORY_CARE_PROVIDER_SITE_OTHER): Payer: 59

## 2018-10-22 ENCOUNTER — Other Ambulatory Visit: Payer: Self-pay

## 2018-10-22 DIAGNOSIS — M79674 Pain in right toe(s): Secondary | ICD-10-CM | POA: Diagnosis not present

## 2018-10-23 ENCOUNTER — Encounter: Payer: Self-pay | Admitting: Sports Medicine

## 2018-10-24 MED ORDER — DIAZEPAM 5 MG PO TABS: ORAL_TABLET | ORAL | 0 refills | Status: DC

## 2018-11-06 ENCOUNTER — Other Ambulatory Visit: Payer: Self-pay | Admitting: Sports Medicine

## 2018-11-06 DIAGNOSIS — S86892A Other injury of other muscle(s) and tendon(s) at lower leg level, left leg, initial encounter: Secondary | ICD-10-CM

## 2018-11-07 ENCOUNTER — Encounter: Payer: Self-pay | Admitting: Sports Medicine

## 2018-11-07 ENCOUNTER — Other Ambulatory Visit: Payer: Self-pay

## 2018-11-07 ENCOUNTER — Ambulatory Visit (INDEPENDENT_AMBULATORY_CARE_PROVIDER_SITE_OTHER): Payer: 59 | Admitting: Sports Medicine

## 2018-11-07 DIAGNOSIS — M2580 Other specified joint disorders, unspecified joint: Secondary | ICD-10-CM

## 2018-11-07 NOTE — Assessment & Plan Note (Addendum)
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 Take it easy with activities of daily living only for the next 4 days. Return to see me in a month. If insufficient improvement then we can consider sesamoidectomy.

## 2018-11-07 NOTE — Progress Notes (Signed)
Subjective:    CC: Follow-up  HPI: This pleasant 33 year old female returns, we have treated her extensively for plantar first MTP pain, she is a runner.  We have tried activity modification, unloading pads in the feet, NSAIDs, more recently we obtained an MRI that showed a suspicion for medial sesamoiditis.  Pain is moderate, persistent, localized without radiation.  I reviewed the past medical history, family history, social history, surgical history, and allergies today and no changes were needed.  Please see the problem list section below in epic for further details.  Past Medical History: Past Medical History:  Diagnosis Date  . Colitis   . Genital warts    Past Surgical History: No past surgical history on file. Social History: Social History   Socioeconomic History  . Marital status: Married    Spouse name: Not on file  . Number of children: Not on file  . Years of education: Not on file  . Highest education level: Not on file  Occupational History  . Not on file  Social Needs  . Financial resource strain: Not on file  . Food insecurity    Worry: Not on file    Inability: Not on file  . Transportation needs    Medical: Not on file    Non-medical: Not on file  Tobacco Use  . Smoking status: Never Smoker  . Smokeless tobacco: Never Used  Substance and Sexual Activity  . Alcohol use: No  . Drug use: No  . Sexual activity: Yes    Partners: Male    Birth control/protection: Pill  Lifestyle  . Physical activity    Days per week: Not on file    Minutes per session: Not on file  . Stress: Not on file  Relationships  . Social Herbalist on phone: Not on file    Gets together: Not on file    Attends religious service: Not on file    Active member of club or organization: Not on file    Attends meetings of clubs or organizations: Not on file    Relationship status: Not on file  Other Topics Concern  . Not on file  Social History Narrative  . Not on  file   Family History: Family History  Problem Relation Age of Onset  . Hypertension Father   . Stroke Maternal Grandfather   . Heart disease Maternal Grandfather    Allergies: No Known Allergies Medications: See med rec.  Review of Systems: No fevers, chills, night sweats, weight loss, chest pain, or shortness of breath.   Objective:    General: Well Developed, well nourished, and in no acute distress.  Neuro: Alert and oriented x3, extra-ocular muscles intact, sensation grossly intact.  HEENT: Normocephalic, atraumatic, pupils equal round reactive to light, neck supple, no masses, no lymphadenopathy, thyroid nonpalpable.  Skin: Warm and dry, no rashes. Cardiac: Regular rate and rhythm, no murmurs rubs or gallops, no lower extremity edema.  Respiratory: Clear to auscultation bilaterally. Not using accessory muscles, speaking in full sentences. Right foot: Tender to palpation over the medial sesamoid.  Procedure: Real-time Ultrasound Guided injection of the right medial bipartite sesamoid Device: GE Logiq E  Verbal informed consent obtained.  Time-out conducted.  Noted no overlying erythema, induration, or other signs of local infection.  Skin prepped in a sterile fashion.  Local anesthesia: Topical Ethyl chloride.  With sterile technique and under real time ultrasound guidance:  25-gauge needle advanced just deep to the medial sesamoid, I  then injected 1 cc Kenalog 40, 1 cc lidocaine Completed without difficulty  Pain immediately resolved suggesting accurate placement of the medication.  Advised to call if fevers/chills, erythema, induration, drainage, or persistent bleeding.  Images permanently stored and available for review in the ultrasound unit.  Impression: Technically successful ultrasound guided injection.  Impression and Recommendations:    Pain of great toe, right Medial sesamoiditis on MRI, this was injected today. Take it easy with activities of daily living  only for the next 4 days. Return to see me in a month.   ___________________________________________ Ihor Austinhomas J. Benjamin Stainhekkekandam, M.D., ABFM., CAQSM. Primary Care and Sports Medicine [facility]  Adjunct Professor of Family Medicine  [employer]

## 2018-12-05 ENCOUNTER — Encounter: Payer: Self-pay | Admitting: Sports Medicine

## 2018-12-05 ENCOUNTER — Other Ambulatory Visit: Payer: Self-pay

## 2018-12-05 ENCOUNTER — Ambulatory Visit (INDEPENDENT_AMBULATORY_CARE_PROVIDER_SITE_OTHER): Payer: 59 | Admitting: Sports Medicine

## 2018-12-05 DIAGNOSIS — G4484 Primary exertional headache: Secondary | ICD-10-CM | POA: Diagnosis not present

## 2018-12-05 DIAGNOSIS — M2580 Other specified joint disorders, unspecified joint: Secondary | ICD-10-CM | POA: Diagnosis not present

## 2018-12-05 MED ORDER — DIAZEPAM 5 MG PO TABS: ORAL_TABLET | ORAL | 0 refills | Status: DC

## 2018-12-05 NOTE — Progress Notes (Signed)
Subjective:    CC: Follow-up  HPI: Megan Willis returns, she is a pleasant 33 year old female, we treated her for sesamoiditis recently, injected her medial sesamoid and she returns almost completely pain-free.  Unfortunately she has noted a severe headache after working out, bifrontal, bitemporal, no visual changes, no nausea, no focal neurologic symptoms, no head trauma.  She does have a history of migraines under moderate control.  I reviewed the past medical history, family history, social history, surgical history, and allergies today and no changes were needed.  Please see the problem list section below in epic for further details.  Past Medical History: Past Medical History:  Diagnosis Date  . Colitis   . Genital warts    Past Surgical History: No past surgical history on file. Social History: Social History   Socioeconomic History  . Marital status: Married    Spouse name: Not on file  . Number of children: Not on file  . Years of education: Not on file  . Highest education level: Not on file  Occupational History  . Not on file  Social Needs  . Financial resource strain: Not on file  . Food insecurity    Worry: Not on file    Inability: Not on file  . Transportation needs    Medical: Not on file    Non-medical: Not on file  Tobacco Use  . Smoking status: Never Smoker  . Smokeless tobacco: Never Used  Substance and Sexual Activity  . Alcohol use: No  . Drug use: No  . Sexual activity: Yes    Partners: Male    Birth control/protection: Pill  Lifestyle  . Physical activity    Days per week: Not on file    Minutes per session: Not on file  . Stress: Not on file  Relationships  . Social Musicianconnections    Talks on phone: Not on file    Gets together: Not on file    Attends religious service: Not on file    Active member of club or organization: Not on file    Attends meetings of clubs or organizations: Not on file    Relationship status: Not on file  Other  Topics Concern  . Not on file  Social History Narrative  . Not on file   Family History: Family History  Problem Relation Age of Onset  . Hypertension Father   . Stroke Maternal Grandfather   . Heart disease Maternal Grandfather    Allergies: No Known Allergies Medications: See med rec.  Review of Systems: No fevers, chills, night sweats, weight loss, chest pain, or shortness of breath.   Objective:    General: Well Developed, well nourished, and in no acute distress.  Neuro: Alert and oriented x3, extra-ocular muscles intact, sensation grossly intact.  Cranial nerves II through XII are intact, motor, sensory, coordinative functions are all intact. HEENT: Normocephalic, atraumatic, pupils equal round reactive to light, neck supple, no masses, no lymphadenopathy, thyroid nonpalpable.  Skin: Warm and dry, no rashes. Cardiac: Regular rate and rhythm, no murmurs rubs or gallops, no lower extremity edema.  Respiratory: Clear to auscultation bilaterally. Not using accessory muscles, speaking in full sentences.  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 Return as needed, if persistence or recurrence we will consider sesamoidectomy.  Headache, primary exertional I think this is a primary exertional headache, patient does have a history of migraines that are  under moderate control. Only associated with intense exercise, but it was the worst headache of her life, adding a brain MRI with and without contrast, Valium for preprocedural anxiolysis.   ___________________________________________ Gwen Her. Dianah Field, M.D., ABFM., CAQSM. Primary Care and Sports Medicine [facility]  Adjunct Professor of [place] [employer]

## 2018-12-05 NOTE — Assessment & Plan Note (Signed)
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 Return as needed, if persistence or recurrence we will consider sesamoidectomy.

## 2018-12-05 NOTE — Assessment & Plan Note (Signed)
I think this is a primary exertional headache, patient does have a history of migraines that are under moderate control. Only associated with intense exercise, but it was the worst headache of her life, adding a brain MRI with and without contrast, Valium for preprocedural anxiolysis.

## 2018-12-11 ENCOUNTER — Other Ambulatory Visit: Payer: Self-pay

## 2018-12-11 ENCOUNTER — Encounter: Payer: Self-pay | Admitting: Sports Medicine

## 2018-12-11 ENCOUNTER — Ambulatory Visit (INDEPENDENT_AMBULATORY_CARE_PROVIDER_SITE_OTHER): Payer: 59

## 2018-12-11 DIAGNOSIS — G4484 Primary exertional headache: Secondary | ICD-10-CM

## 2018-12-11 MED ORDER — GADOBUTROL 1 MMOL/ML IV SOLN
7.5000 mL | Freq: Once | INTRAVENOUS | Status: AC | PRN
  Administered 2018-12-11: 7.5 mL via INTRAVENOUS

## 2019-02-21 ENCOUNTER — Encounter: Payer: Self-pay | Admitting: Sports Medicine

## 2019-03-20 ENCOUNTER — Other Ambulatory Visit: Payer: Self-pay | Admitting: *Deleted

## 2019-03-20 DIAGNOSIS — S86892A Other injury of other muscle(s) and tendon(s) at lower leg level, left leg, initial encounter: Secondary | ICD-10-CM

## 2019-03-20 MED ORDER — MELOXICAM 15 MG PO TABS: ORAL_TABLET | ORAL | 3 refills | Status: DC

## 2019-07-16 ENCOUNTER — Other Ambulatory Visit: Payer: Self-pay | Admitting: Sports Medicine

## 2019-07-16 DIAGNOSIS — S86892A Other injury of other muscle(s) and tendon(s) at lower leg level, left leg, initial encounter: Secondary | ICD-10-CM

## 2019-12-26 ENCOUNTER — Encounter: Payer: Self-pay | Admitting: Sports Medicine

## 2019-12-26 ENCOUNTER — Other Ambulatory Visit: Payer: Self-pay

## 2019-12-26 ENCOUNTER — Ambulatory Visit (INDEPENDENT_AMBULATORY_CARE_PROVIDER_SITE_OTHER): Payer: 59 | Admitting: Sports Medicine

## 2019-12-26 ENCOUNTER — Ambulatory Visit (INDEPENDENT_AMBULATORY_CARE_PROVIDER_SITE_OTHER): Payer: 59

## 2019-12-26 DIAGNOSIS — M19071 Primary osteoarthritis, right ankle and foot: Secondary | ICD-10-CM | POA: Diagnosis not present

## 2019-12-26 DIAGNOSIS — M79674 Pain in right toe(s): Secondary | ICD-10-CM | POA: Diagnosis not present

## 2019-12-26 NOTE — Assessment & Plan Note (Signed)
This is a pleasant 34 year old female, we have been treating her in the past for a sesamoiditis diagnosed on MRI, she had a right first medial sesamoid injection, seemed to work well for a year. Now she is having a new type of pain that she localizes at the first MTP, mild palpable synovitis. Adding x-rays, we injected her MTP today as meloxicam as not effective. She does have some custom orthotics that she will continue to wear, return to see me in a month.

## 2019-12-26 NOTE — Progress Notes (Signed)
    Procedures performed today:    Procedure: Real-time Ultrasound Guided injection of the right first MTP Device: Samsung HS60  Verbal informed consent obtained.  Time-out conducted.  Noted no overlying erythema, induration, or other signs of local infection.  Skin prepped in a sterile fashion.  Local anesthesia: Topical Ethyl chloride.  With sterile technique and under real time ultrasound guidance:  0.5 cc lidocaine, 0.5 cc kenalog 40 injected easily completed without difficulty  Pain immediately resolved suggesting accurate placement of the medication.  Advised to call if fevers/chills, erythema, induration, drainage, or persistent bleeding.  Images permanently stored and available for review in PACS.  Impression: Technically successful ultrasound guided injection.  Independent interpretation of notes and tests performed by another provider:   None.  Brief History, Exam, Impression, and Recommendations:    Arthritis of first metatarsophalangeal (MTP) joint of right foot This is a pleasant 34 year old female, we have been treating her in the past for a sesamoiditis diagnosed on MRI, she had a right first medial sesamoid injection, seemed to work well for a year. Now she is having a new type of pain that she localizes at the first MTP, mild palpable synovitis. Adding x-rays, we injected her MTP today as meloxicam as not effective. She does have some custom orthotics that she will continue to wear, return to see me in a month.    ___________________________________________ Ihor Austin. Benjamin Stain, M.D., ABFM., CAQSM. Primary Care and Sports Medicine [facility]  Adjunct Instructor of Family Medicine  [employer]

## 2020-01-25 ENCOUNTER — Ambulatory Visit (INDEPENDENT_AMBULATORY_CARE_PROVIDER_SITE_OTHER): Payer: 59 | Admitting: Sports Medicine

## 2020-01-25 ENCOUNTER — Other Ambulatory Visit: Payer: Self-pay

## 2020-01-25 DIAGNOSIS — M62838 Other muscle spasm: Secondary | ICD-10-CM | POA: Diagnosis not present

## 2020-01-25 DIAGNOSIS — M19071 Primary osteoarthritis, right ankle and foot: Secondary | ICD-10-CM

## 2020-01-25 NOTE — Progress Notes (Signed)
    Procedures performed today:    None.  Independent interpretation of notes and tests performed by another provider:   None.  Brief History, Exam, Impression, and Recommendations:    Arthritis of first metatarsophalangeal (MTP) joint of right foot Megan Willis returns, she is pleasant 34 year old female, she did well after a right first medial sesamoid injection in the past, pain is at the MTP of the last visit with mild palpable synovitis, with failure of first metatarsal ray posting so we injected the joint, symptom-free now.  Cervical paraspinal muscle spasm Has gone to a chiropractor, overall pain is minimal, nothing radicular, no red flags. I demonstrated traction, she will do some home rehab exercises, exercise adequate posture when on her computer, and return to see me in an as-needed basis.    ___________________________________________ Ihor Austin. Benjamin Stain, M.D., ABFM., CAQSM. Primary Care and Sports Medicine [facility]  Adjunct Instructor of Family Medicine  [employer]

## 2020-01-25 NOTE — Assessment & Plan Note (Signed)
Megan Willis returns, she is pleasant 34 year old female, she did well after a right first medial sesamoid injection in the past, pain is at the MTP of the last visit with mild palpable synovitis, with failure of first metatarsal ray posting so we injected the joint, symptom-free now.

## 2020-01-25 NOTE — Assessment & Plan Note (Signed)
Has gone to a chiropractor, overall pain is minimal, nothing radicular, no red flags. I demonstrated traction, she will do some home rehab exercises, exercise adequate posture when on her computer, and return to see me in an as-needed basis.

## 2020-03-31 ENCOUNTER — Other Ambulatory Visit: Payer: Self-pay | Admitting: Sports Medicine

## 2020-03-31 DIAGNOSIS — S86892A Other injury of other muscle(s) and tendon(s) at lower leg level, left leg, initial encounter: Secondary | ICD-10-CM

## 2020-03-31 MED ORDER — MELOXICAM 15 MG PO TABS: 15.0000 mg | ORAL_TABLET | Freq: Every day | ORAL | 3 refills | Status: AC | PRN

## 2020-04-17 DIAGNOSIS — M19071 Primary osteoarthritis, right ankle and foot: Secondary | ICD-10-CM

## 2020-05-02 ENCOUNTER — Ambulatory Visit (INDEPENDENT_AMBULATORY_CARE_PROVIDER_SITE_OTHER): Payer: 59 | Admitting: Podiatry

## 2020-05-02 ENCOUNTER — Other Ambulatory Visit: Payer: Self-pay

## 2020-05-02 ENCOUNTER — Ambulatory Visit (INDEPENDENT_AMBULATORY_CARE_PROVIDER_SITE_OTHER): Payer: 59

## 2020-05-02 DIAGNOSIS — M19071 Primary osteoarthritis, right ankle and foot: Secondary | ICD-10-CM | POA: Diagnosis not present

## 2020-05-02 DIAGNOSIS — M79671 Pain in right foot: Secondary | ICD-10-CM

## 2020-05-02 DIAGNOSIS — M2580 Other specified joint disorders, unspecified joint: Secondary | ICD-10-CM | POA: Diagnosis not present

## 2020-05-06 NOTE — Progress Notes (Signed)
Subjective:   Patient ID: Megan Willis, female   DOB: 35 y.o.   MRN: [id number]   HPI 35 year old female presents the office today for concerns of chronic discomfort on the right first MPJ as well as sesamoiditis. This been ongoing now for about a year and she denies recent injury or trauma the time of onset of symptoms. She had had injections previously which did help some but the pain does come back. She states this started after she started running for exercise. She states is most tender when she tries to flex the toe. Occasional swelling. No other concerns today.  Review of Systems  All other systems reviewed and are negative.  Past Medical History:  Diagnosis Date  . Colitis   . Genital warts     No past surgical history on file.   Current Outpatient Medications:  .  Calcium Carbonate-Vitamin D 600-400 MG-UNIT tablet, Take 1 tablet by mouth 2 (two) times daily., Disp: 60 tablet, Rfl: 11 .  HAILEY 1.5/30 1.5-30 MG-MCG tablet, Take by mouth., Disp: , Rfl:  .  meloxicam (MOBIC) 15 MG tablet, Take 1 tablet (15 mg total) by mouth daily as needed for pain., Disp: 90 tablet, Rfl: 3 .  norethindrone-ethinyl estradiol (TRIPHASIL,CYCLAFEM,ALYACEN) 0.5/0.75/1-35 MG-MCG tablet, Take 1 tablet by mouth daily., Disp: , Rfl:  .  SUMAtriptan (IMITREX) 50 MG tablet, Take by mouth., Disp: , Rfl:  .  triamcinolone (NASACORT) 55 MCG/ACT AERO nasal inhaler, Place 2 sprays into the nose daily., Disp: 1 Inhaler, Rfl: 11 .  valACYclovir (VALTREX) 1000 MG tablet, Take 2,000 mg by mouth., Disp: , Rfl:   No Known Allergies       Objective:  Physical Exam  General: AAO x3, NAD  Dermatological: Skin is warm, dry and supple bilateral. There are no open sores, no preulcerative lesions, no rash or signs of infection present.  Vascular: Dorsalis Pedis artery and Posterior Tibial artery pedal pulses are 2/4 bilateral with immedate capillary fill time. There is no pain with calf compression, swelling, warmth,  erythema.   Neruologic: Grossly intact via light touch bilateral.   Musculoskeletal: There is not appear to be significant tenderness with first MPJ range of motion there is no crepitation or restriction with first MPJ range of motion. Majority tenderness is localized along the sesamoid complex most of the medial sesamoid. There is minimal edema. There is no erythema or warmth. Discomfort flexion of the toe. Muscular strength 5/5 in all groups tested bilateral.  Gait: Unassisted, Nonantalgic.       Assessment:   Sesamoiditis    Plan:  -Treatment options discussed including all alternatives, risks, and complications -Etiology of symptoms were discussed -She does have orthotics from another issue but not offloading the first MPJ. I will have her follow-up in the [facility] office to see our pedorthotist, Raiford Noble about either modifications of her custom orthotic such as new inserts to help offload. We discussed MRI but will start with the inserts to see if this is helpful prior to the MRI. Anti-inflammatories as needed.  Vivi Barrack DPM

## 2020-05-13 ENCOUNTER — Other Ambulatory Visit: Payer: Self-pay

## 2020-05-13 ENCOUNTER — Ambulatory Visit (INDEPENDENT_AMBULATORY_CARE_PROVIDER_SITE_OTHER): Payer: 59 | Admitting: Orthotics

## 2020-05-13 DIAGNOSIS — M25872 Other specified joint disorders, left ankle and foot: Secondary | ICD-10-CM | POA: Diagnosis not present

## 2020-05-13 DIAGNOSIS — M19071 Primary osteoarthritis, right ankle and foot: Secondary | ICD-10-CM | POA: Diagnosis not present

## 2020-05-13 DIAGNOSIS — M2580 Other specified joint disorders, unspecified joint: Secondary | ICD-10-CM

## 2020-05-13 NOTE — Progress Notes (Signed)
Cast today for CMFO to address OA MTP right.  Plan on rigid morton extension to limit ROM 1st; also rverse mortons to help windlass mec Richy

## 2020-06-10 ENCOUNTER — Ambulatory Visit (INDEPENDENT_AMBULATORY_CARE_PROVIDER_SITE_OTHER): Payer: 59 | Admitting: Podiatry

## 2020-06-10 ENCOUNTER — Other Ambulatory Visit: Payer: Self-pay

## 2020-06-10 DIAGNOSIS — M19071 Primary osteoarthritis, right ankle and foot: Secondary | ICD-10-CM

## 2020-06-10 DIAGNOSIS — M2580 Other specified joint disorders, unspecified joint: Secondary | ICD-10-CM

## 2020-06-10 NOTE — Patient Instructions (Signed)

## 2020-06-10 NOTE — Progress Notes (Signed)
Patient presents today for orthotic pick up. Patient voices no new complaints.  Orthotics were fitted to patient's feet. No discomfort and no rubbing.  Patient satisfied with the orthotics.  Orthotics were dispensed to patient with instructions for break in wear and to call the office with any concerns or questions.  Seen by Hadley Pen, CMA

## 2020-07-27 ENCOUNTER — Encounter: Payer: Self-pay | Admitting: Emergency Medicine

## 2020-07-27 ENCOUNTER — Emergency Department (INDEPENDENT_AMBULATORY_CARE_PROVIDER_SITE_OTHER)
Admission: EM | Admit: 2020-07-27 | Discharge: 2020-07-27 | Disposition: A | Discharge status: Home / Self Care | Payer: 59 | Source: Home / Self Care

## 2020-07-27 ENCOUNTER — Other Ambulatory Visit: Payer: Self-pay

## 2020-07-27 DIAGNOSIS — K0889 Other specified disorders of teeth and supporting structures: Secondary | ICD-10-CM

## 2020-07-27 DIAGNOSIS — K047 Periapical abscess without sinus: Secondary | ICD-10-CM

## 2020-07-27 MED ORDER — HYDROCODONE-ACETAMINOPHEN 5-325 MG PO TABS: 1.0000 | ORAL_TABLET | Freq: Four times a day (QID) | ORAL | 0 refills | Status: AC | PRN

## 2020-07-27 MED ORDER — AMOXICILLIN-POT CLAVULANATE 875-125 MG PO TABS: 1.0000 | ORAL_TABLET | Freq: Two times a day (BID) | ORAL | 0 refills | Status: AC

## 2020-07-27 NOTE — ED Triage Notes (Signed)
Patient c/o right lower tooth pain since Friday.  Patient has applied ice pack, Ibuprofen, Oralagel, applied heat, no relief.

## 2020-07-27 NOTE — ED Provider Notes (Signed)
[facility]    CSN: [id number] Arrival date & time: 07/27/20  0933      History   Chief Complaint Chief Complaint  Patient presents with  . Dental Pain    HPI Megan Willis is a 35 y.o. female.   Reports dental pain to the right upper jaw for the last 2 days.  Has taken ibuprofen and Tylenol without any relief.  Has been using ice pack without relief.  Has history of dental caries and has had a root canal and crown in this area.  There do not seem to be any alleviating symptoms.  Pain is aggravated with eating.  Denies fever, cough, headache, nausea, vomiting, diarrhea, rash, other symptoms.  ROS per HPI  The history is provided by the patient.    Past Medical History:  Diagnosis Date  . Colitis   . Genital warts     Patient Active Problem List   Diagnosis Date Noted  . Cervical paraspinal muscle spasm 01/25/2020  . Arthritis of first metatarsophalangeal (MTP) joint of right foot 12/26/2019  . Headache, primary exertional 12/05/2018  . Allergic rhinitis 08/16/2018  . Right medial sesamoiditis 07/19/2018  . Trochanteric bursitis, right hip 07/19/2018  . 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 Chronic bilateral low back pain without sciatica 09/18/2015  . Plantar fasciitis, bilateral 07/28/2015  . Genital warts 12/18/2013  . History of colitis 02/27/2013  . Oral contraceptive pill surveillance 03/15/2012  . 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 No pertinent surgical history.  OB History    Gravida  0   Para  0   Term  0   Preterm  0   AB  0   Living  0     SAB  0   IAB  0   Ectopic  0   Multiple  0   Live Births               Home Medications    Prior to Admission medications   Medication Sig Start Date End Date Taking? Authorizing Provider  amoxicillin-clavulanate (AUGMENTIN) 875-125 MG tablet Take 1 tablet by mouth 2 (two) times daily for 7 days. 07/27/20 08/03/20 Yes Moshe Cipro,  NP  Calcium Carbonate-Vitamin D 600-400 MG-UNIT tablet Take 1 tablet by mouth 2 (two) times daily. 07/19/18  Yes Monica Becton, MD  HAILEY 1.5/30 1.5-30 MG-MCG tablet Take by mouth. 04/01/20  Yes [provider]  HYDROcodone-acetaminophen (NORCO/VICODIN) 5-325 MG tablet Take 1 tablet by mouth every 6 (six) hours as needed for up to 3 days for moderate pain or severe pain. 07/27/20 07/30/20 Yes Moshe Cipro, NP  meloxicam (MOBIC) 15 MG tablet Take 1 tablet (15 mg total) by mouth daily as needed for pain. 03/31/20  Yes Monica Becton, MD  Olopatadine HCl 0.6 % SOLN SMARTSIG:1-2 Spray(s) Both Nares Twice Daily PRN 07/17/20  Yes [provider]  SUMAtriptan (IMITREX) 50 MG tablet Take by mouth. 04/01/20  Yes [provider]  valACYclovir (VALTREX) 1000 MG tablet Take 2,000 mg by mouth. 07/30/13  Yes [provider]  norethindrone-ethinyl estradiol (TRIPHASIL,CYCLAFEM,ALYACEN) 0.5/0.75/1-35 MG-MCG tablet Take 1 tablet by mouth daily.    [provider]  triamcinolone (NASACORT) 55 MCG/ACT AERO nasal inhaler Place 2 sprays into the nose daily. 08/16/18   Monica Becton, MD    Family History Family History  Problem Relation Age of Onset  . Hypertension Father   . Stroke Maternal Grandfather   .  Heart disease Maternal Grandfather     Social History Social History   Tobacco Use  . Smoking status: Never Smoker  . Smokeless tobacco: Never Used  Substance Use Topics  . Alcohol use: No  . Drug use: No     Allergies   Patient has no known allergies.   Review of Systems Review of Systems   Physical Exam Triage Vital Signs ED Triage Vitals  Enc Vitals Group     BP 07/27/20 1015 (!) 138/105     Pulse Rate 07/27/20 1015 (!) 121     Resp --      Temp 07/27/20 1015 99 F (37.2 C)     Temp Source 07/27/20 1015 Oral     SpO2 07/27/20 1015 98 %     Weight --      Height --      Head Circumference --      Peak Flow --       Pain Score 07/27/20 1016 7     Pain Loc --      Pain Edu? --      Excl. in GC? --    No data found.  Updated Vital Signs BP (!) 138/105 (BP Location: Right Arm)   Pulse (!) 121   Temp 99 F (37.2 C) (Oral)   LMP 07/21/2020   SpO2 98%   Visual Acuity Right Eye Distance:   Left Eye Distance:   Bilateral Distance:    Right Eye Near:   Left Eye Near:    Bilateral Near:     Physical Exam Vitals and nursing note reviewed.  Constitutional:      General: She is not in acute distress.    Appearance: Normal appearance. She is well-developed. She is not ill-appearing.  HENT:     Head: Normocephalic and atraumatic.     Right Ear: Tympanic membrane, ear canal and external ear normal.     Left Ear: Tympanic membrane, ear canal and external ear normal.     Nose: Nose normal.     Mouth/Throat:     Mouth: Mucous membranes are moist.     Pharynx: Oropharynx is clear.  Eyes:     Extraocular Movements: Extraocular movements intact.     Conjunctiva/sclera: Conjunctivae normal.     Pupils: Pupils are equal, round, and reactive to light.  Cardiovascular:     Rate and Rhythm: Normal rate and regular rhythm.     Heart sounds: No murmur heard.   Pulmonary:     Effort: Pulmonary effort is normal. No respiratory distress.     Breath sounds: Normal breath sounds.  Abdominal:     Palpations: Abdomen is soft.     Tenderness: There is no abdominal tenderness.  Musculoskeletal:        General: Normal range of motion.     Cervical back: Normal range of motion and neck supple.  Skin:    General: Skin is warm and dry.     Capillary Refill: Capillary refill takes less than 2 seconds.  Neurological:     General: No focal deficit present.     Mental Status: She is alert and oriented to person, place, and time.  Psychiatric:        Mood and Affect: Mood normal.        Behavior: Behavior normal.        Thought Content: Thought content normal.      UC Treatments / Results  Labs (all labs  ordered are listed, but  only abnormal results are displayed) Labs Reviewed - No data to display  EKG   Radiology No results found.  Procedures Procedures (including critical care time)  Medications Ordered in UC Medications - No data to display  Initial Impression / Assessment and Plan / UC Course  I have reviewed the triage vital signs and the nursing notes.  Pertinent labs & imaging results that were available during my care of the patient were reviewed by me and considered in my medical decision making (see chart for details).    Dental pain Dental infection  Prescribed Augmentin 875 twice daily x7 days, printed prescription per patient request Norco prescribed for breakthrough pain as needed, also printed for patient today Follow-up with the dentist as soon as you can Follow-up with the ER for high fever, trouble swallowing, trouble breathing, other concerning symptoms  Final Clinical Impressions(s) / UC Diagnoses   Final diagnoses:  Pain, dental  Dental infection     Discharge Instructions     I have sent in Augmentin for you to take twice a day for 7 days.  Take ibuprofen as needed for pain.    Take the prescribed Norco as needed for severe pain; do not drive, operate machinery, or drink alcohol with this medication as it may cause drowsiness.  I have provided these prescriptions printed for you today  Follow up with dentist as scheduled    ED Prescriptions    Medication Sig Dispense Auth. Provider   amoxicillin-clavulanate (AUGMENTIN) 875-125 MG tablet Take 1 tablet by mouth 2 (two) times daily for 7 days. 14 tablet Moshe Cipro, NP   HYDROcodone-acetaminophen (NORCO/VICODIN) 5-325 MG tablet Take 1 tablet by mouth every 6 (six) hours as needed for up to 3 days for moderate pain or severe pain. 10 tablet Moshe Cipro, NP     I have reviewed the PDMP during this encounter.   Moshe Cipro, NP 07/27/20 1044

## 2020-07-27 NOTE — Discharge Instructions (Addendum)
I have sent in Augmentin for you to take twice a day for 7 days.  Take ibuprofen as needed for pain.    Take the prescribed Norco as needed for severe pain; do not drive, operate machinery, or drink alcohol with this medication as it may cause drowsiness.  I have provided these prescriptions printed for you today  Follow up with dentist as scheduled

## 2020-09-01 ENCOUNTER — Other Ambulatory Visit: Payer: Self-pay

## 2020-09-01 ENCOUNTER — Ambulatory Visit (INDEPENDENT_AMBULATORY_CARE_PROVIDER_SITE_OTHER): Payer: 59 | Admitting: Podiatry

## 2020-09-01 DIAGNOSIS — T148XXA Other injury of unspecified body region, initial encounter: Secondary | ICD-10-CM | POA: Diagnosis not present

## 2020-09-01 DIAGNOSIS — M19071 Primary osteoarthritis, right ankle and foot: Secondary | ICD-10-CM | POA: Diagnosis not present

## 2020-09-06 NOTE — Progress Notes (Signed)
Subjective: 35 year old female presents the office today for follow-up evaluation of right foot pain.  She states that she has had some minimal improvement but overall still having discomfort.  She points mostly along the first MPJ where she gets the discomfort.  No recent injury or trauma.  She previously had injections which were helpful with sports medicine and we did orthotics.  She appears an MRI but is been a couple years.  Denies any systemic complaints such as fevers, chills, nausea, vomiting. No acute changes since last appointment, and no other complaints at this time.   Objective: AAO x3, NAD DP/PT pulses palpable bilaterally, CRT less than 3 seconds The majority tenderness is along the first MPJ medially as well as with MPJ range of motion.  No significant pain on the sesamoids.  There is no edema, erythema.  Pain with any discomfort.  MMT 5/5. No pain with calf compression, swelling, warmth, erythema  Assessment: 35 year old female chronic right first MPJ pain  Plan: -All treatment options discussed with the patient including all alternatives, risks, complications.  -At this point given her ongoing symptoms I recommend repeat MRI Tri-Lock capsular, plantar plate full-thickness tear.  We discussed treatment options I would like to get the results back and the MRI before further treatment as she is continue to have chronic discomfort.  This is for potential surgical planning as well. -Patient encouraged to call the office with any questions, concerns, change in symptoms.   Vivi Barrack DPM

## 2020-09-10 ENCOUNTER — Telehealth: Payer: Self-pay | Admitting: *Deleted

## 2020-09-10 NOTE — Telephone Encounter (Signed)
Called and spoke with Avie Arenas at [facility] and the procedure code 01222 was approved and the authorization number is [id number] and is valid from 09-10-2020 thru 03-09-2021 and I called [facility] to let them know about the authorization approval. Misty Stanley

## 2020-09-15 ENCOUNTER — Ambulatory Visit (INDEPENDENT_AMBULATORY_CARE_PROVIDER_SITE_OTHER): Payer: 59

## 2020-09-15 ENCOUNTER — Other Ambulatory Visit: Payer: Self-pay

## 2020-09-15 DIAGNOSIS — M79671 Pain in right foot: Secondary | ICD-10-CM

## 2020-09-15 DIAGNOSIS — G8929 Other chronic pain: Secondary | ICD-10-CM | POA: Diagnosis not present

## 2020-09-15 DIAGNOSIS — T148XXA Other injury of unspecified body region, initial encounter: Secondary | ICD-10-CM

## 2020-09-15 MED ORDER — GADOBUTROL 1 MMOL/ML IV SOLN
8.0000 mL | Freq: Once | INTRAVENOUS | Status: AC | PRN
  Administered 2020-09-15: 10 mL via INTRAVENOUS

## 2020-09-17 ENCOUNTER — Telehealth: Payer: Self-pay | Admitting: *Deleted

## 2020-09-17 NOTE — Telephone Encounter (Signed)
I called and spoke with Megan Willis at [facility] and they have the CD ready and I will stop by the office and get the CD sent out for over read. Misty Stanley

## 2020-09-19 ENCOUNTER — Telehealth: Payer: Self-pay | Admitting: *Deleted

## 2020-09-19 NOTE — Telephone Encounter (Signed)
Sent the CD for over read today per Dr Ardelle Anton and got the CD from [facility] imaging department. Misty Stanley

## 2020-09-19 NOTE — Telephone Encounter (Signed)
-----   Message from Vivi Barrack, DPM sent at 09/16/2020  6:21 PM EDT ----- Misty Stanley- can you send this MRI out for an over read? Thanks.

## 2020-09-25 ENCOUNTER — Encounter: Payer: Self-pay | Admitting: Podiatry

## 2020-10-02 ENCOUNTER — Encounter: Payer: Self-pay | Admitting: Podiatry

## 2020-10-06 ENCOUNTER — Telehealth: Payer: Self-pay | Admitting: *Deleted

## 2020-10-06 ENCOUNTER — Telehealth: Payer: Self-pay | Admitting: Podiatry

## 2020-10-06 NOTE — Telephone Encounter (Signed)
-----   Message from Matthew R Wagoner, DPM sent at 10/03/2020  5:03 PM EDT ----- I sent her a mychart message letting her know. Can you call back Monday morning? Thanks! ----- Message ----- From: Nadav Swindell R, PMAC Sent: 10/03/2020   1:23 PM EDT To: Matthew R Wagoner, DPM  Hey called Over read and they have the report and just waiting on the doctor to sign off on the report. Zillah Alexie ----- Message ----- From: Wagoner, Matthew R, DPM Sent: 10/02/2020   5:44 PM EDT To: Retaj Hilbun R Clydine Parkison, PMAC  Can you follow up on the overread of the MRI Friday morning!? Thanks!    

## 2020-10-06 NOTE — Telephone Encounter (Signed)
I called [facility] and they faxed over the report this morning and Dr Ardelle Anton has the report. Misty Stanley

## 2020-10-06 NOTE — Telephone Encounter (Signed)
Attempted to call patient, went to VM. Left message to call back but will send Mychart message as well.

## 2020-10-08 ENCOUNTER — Telehealth: Payer: Self-pay | Admitting: *Deleted

## 2020-10-08 NOTE — Telephone Encounter (Signed)
Called and left a message for the patient stating that Dr Ardelle Anton left a my chart note in patient's my chart and to call the office if any concerns or questions. Misty Stanley

## 2020-10-08 NOTE — Telephone Encounter (Signed)
-----   Message from Vivi Barrack, DPM sent at 10/03/2020  5:03 PM EDT ----- I sent her a mychart message letting her know. Can you call back Monday morning? Thanks! ----- Message ----- From: Lanney Gins, PMAC Sent: 10/03/2020   1:23 PM EDT To: Vivi Barrack, DPM  Hey called [facility] and they have the report and just waiting on the doctor to sign off on the report. Misty Stanley ----- Message ----- From: Vivi Barrack, DPM Sent: 10/02/2020   5:44 PM EDT To: Lanney Gins, PMAC  Can you follow up on the overread of the MRI Friday morning!? Thanks!

## 2020-10-20 ENCOUNTER — Other Ambulatory Visit: Payer: Self-pay

## 2020-10-20 ENCOUNTER — Ambulatory Visit (INDEPENDENT_AMBULATORY_CARE_PROVIDER_SITE_OTHER): Payer: 59 | Admitting: *Deleted

## 2020-10-20 DIAGNOSIS — M2580 Other specified joint disorders, unspecified joint: Secondary | ICD-10-CM | POA: Diagnosis not present

## 2020-10-20 DIAGNOSIS — T148XXA Other injury of unspecified body region, initial encounter: Secondary | ICD-10-CM

## 2020-10-20 NOTE — Patient Instructions (Signed)

## 2020-10-20 NOTE — Progress Notes (Signed)
Patient presents for the 1st EPAT treatment today with complaint of sub/medial 1st MPJ pain right. Diagnosed with capsulitis/sesamoiditis by Dr. Ardelle Anton. This has been ongoing for several months. The patient has tried ice, stretching, NSAIDS and supportive shoe gear with no long term relief.   Most of the pain is located sub/medial 1st MPJ right.  ESWT administered and tolerated well.Treatment settings initiated at:   Energy: 12  Ended treatment session today with 3000 shocks at the following settings:   Energy: 12  Frequency: 6.0  Joules: 11.77   Reviewed post EPAT instructions. Advised to avoid ice and NSAIDs throughout the treatment process and to utilize boot or supportive shoes for at least the next 3 days.  Follow up for 2nd treatment in 1 week.  Also dispensed patient a short CAM walker today for offloading and immobilization.

## 2020-10-29 ENCOUNTER — Ambulatory Visit (INDEPENDENT_AMBULATORY_CARE_PROVIDER_SITE_OTHER): Payer: 59

## 2020-10-29 ENCOUNTER — Other Ambulatory Visit: Payer: Self-pay

## 2020-10-29 DIAGNOSIS — T148XXA Other injury of unspecified body region, initial encounter: Secondary | ICD-10-CM

## 2020-10-29 NOTE — Progress Notes (Signed)
Patient presents for the 2nd EPAT treatment today with complaint of sub/medial 1st MPJ pain right. Diagnosed with capsulitis/sesamoiditis by Dr. Ardelle Anton. This has been ongoing for several months. The patient has tried ice, stretching, NSAIDS and supportive shoe gear with no long term relief.   Most of the pain is located sub/medial 1st MPJ right.  ESWT administered and tolerated well.Treatment settings initiated at:   Energy: 15  Ended treatment session today with 3000 shocks at the following settings:   Energy: 15  Frequency: 6.0  Joules: 14.72   Reviewed post EPAT instructions. Advised to avoid ice and NSAIDs throughout the treatment process and to utilize boot or supportive shoes for at least the next 3 days.  Follow up for 3rd treatment in 1 week.  Also dispensed patient a short CAM walker today for offloading and immobilization.

## 2020-11-07 ENCOUNTER — Ambulatory Visit (INDEPENDENT_AMBULATORY_CARE_PROVIDER_SITE_OTHER): Payer: 59 | Admitting: *Deleted

## 2020-11-07 ENCOUNTER — Other Ambulatory Visit: Payer: Self-pay

## 2020-11-07 DIAGNOSIS — T148XXA Other injury of unspecified body region, initial encounter: Secondary | ICD-10-CM

## 2020-11-07 NOTE — Progress Notes (Signed)
Patient presents for the 3rd EPAT treatment today with complaint of sub/medial 1st MPJ pain right. Diagnosed with capsulitis/sesamoiditis by Dr. Ardelle Anton. This has been ongoing for several months. The patient has tried ice, stretching, NSAIDS and supportive shoe gear with no long term relief.   Most of the pain is located sub/medial 1st MPJ right. She has seen some improvement.  ESWT administered and tolerated well.Treatment settings initiated at:   Energy: 20  Ended treatment session today with 3000 shocks at the following settings:   Energy: 20  Frequency: 5.0  Joules: 19.62   Reviewed post EPAT instructions. Advised to avoid ice and NSAIDs throughout the treatment process and to utilize boot or supportive shoes for at least the next 3 days.  Follow up for 4th treatment in 2 week.

## 2020-11-14 IMAGING — DX DG FOOT COMPLETE 3+V*R*
3 series · 3 of 3 positions shown · non-contrast
Comparison: September 13, 2018.

CLINICAL DATA: Right first toe pain for a month.

EXAM:
RIGHT FOOT COMPLETE - 3+ VIEW

[foot ap]
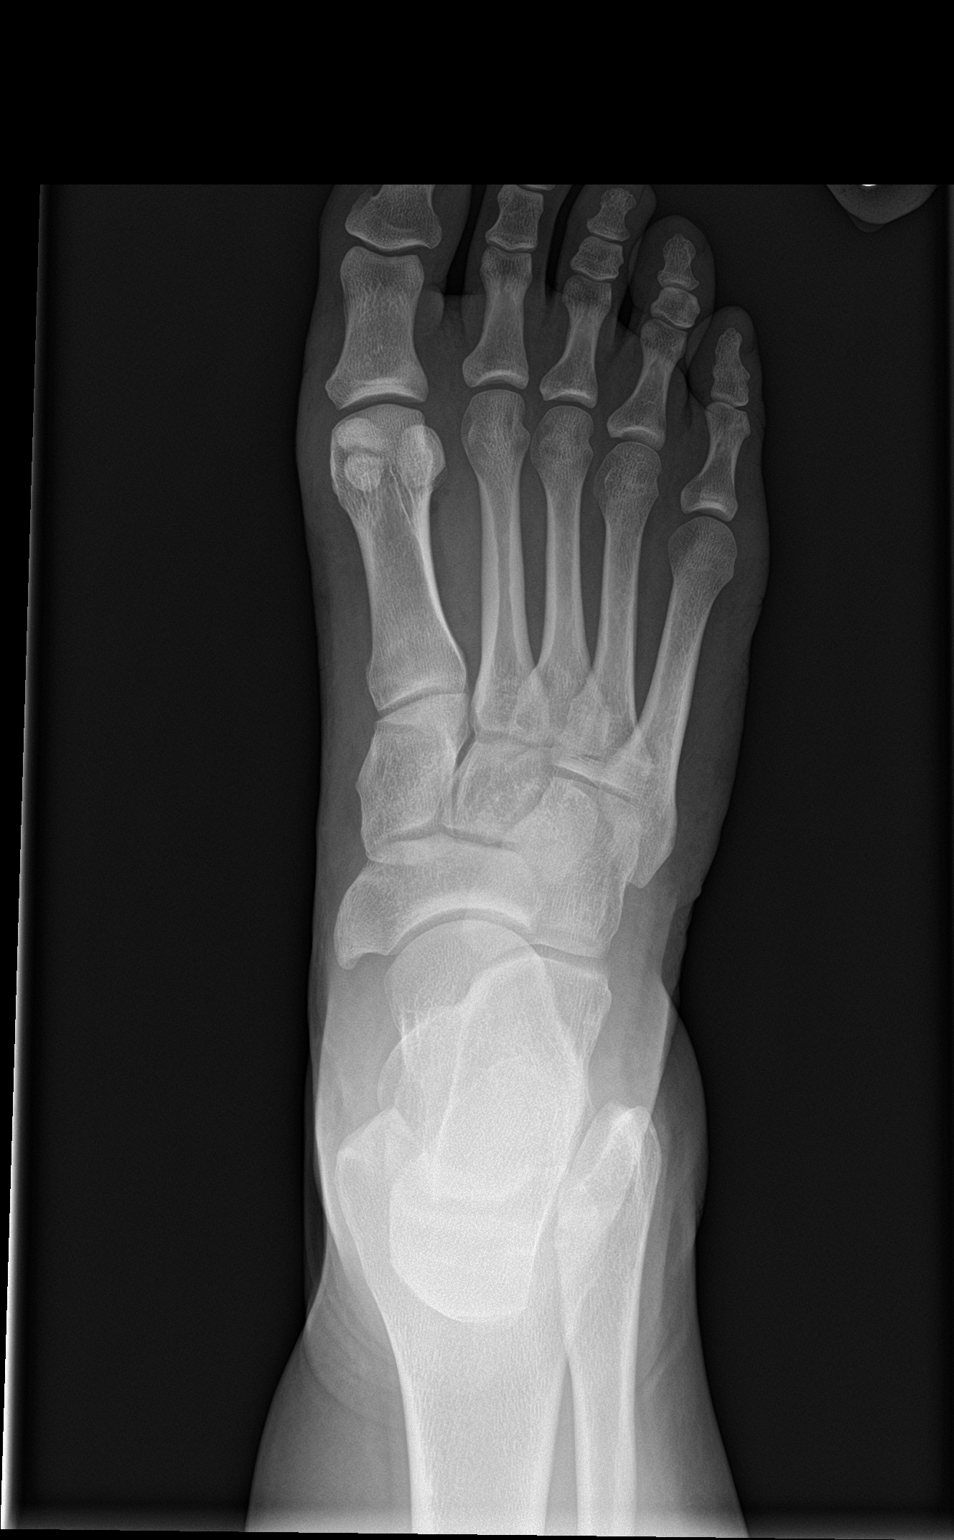

[foot obl]
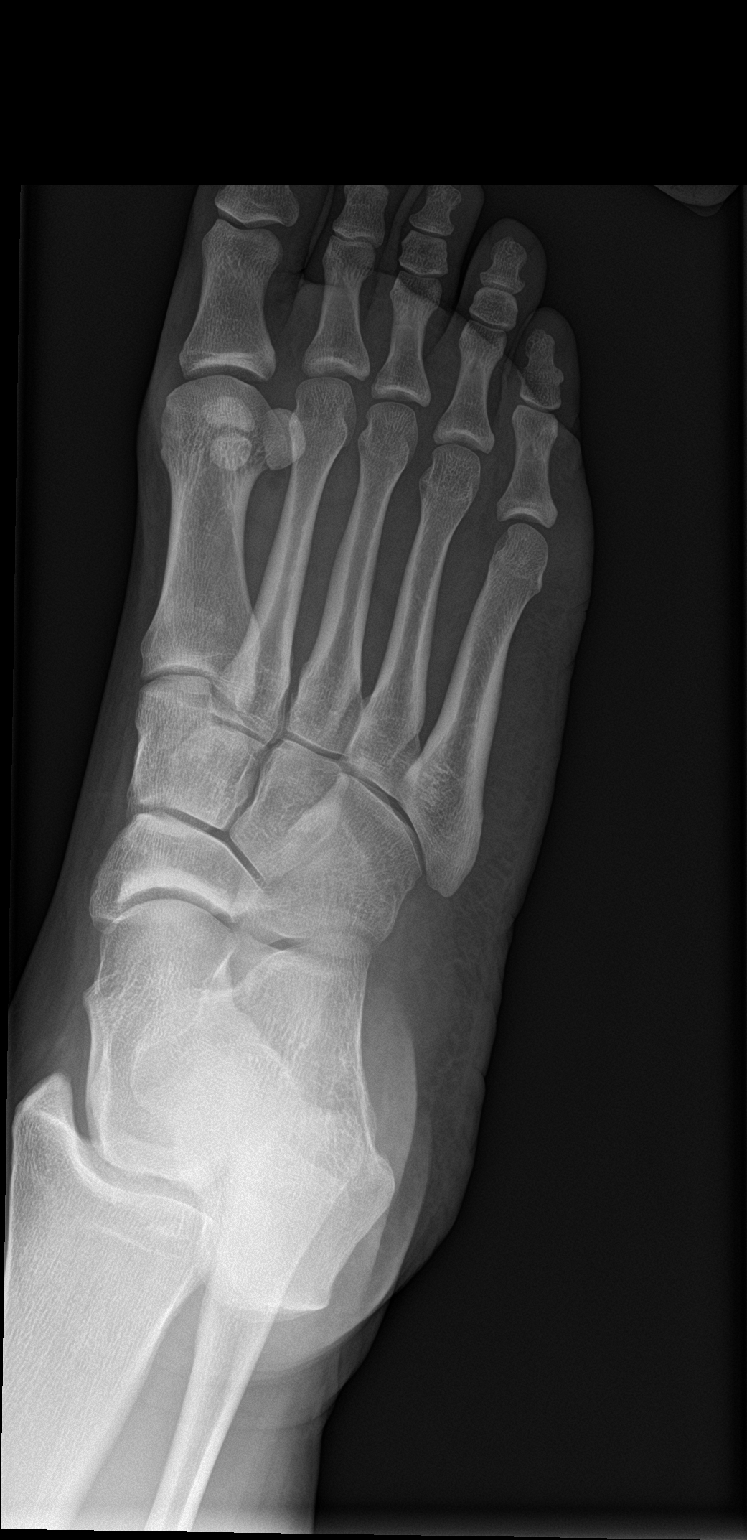

[foot lat]
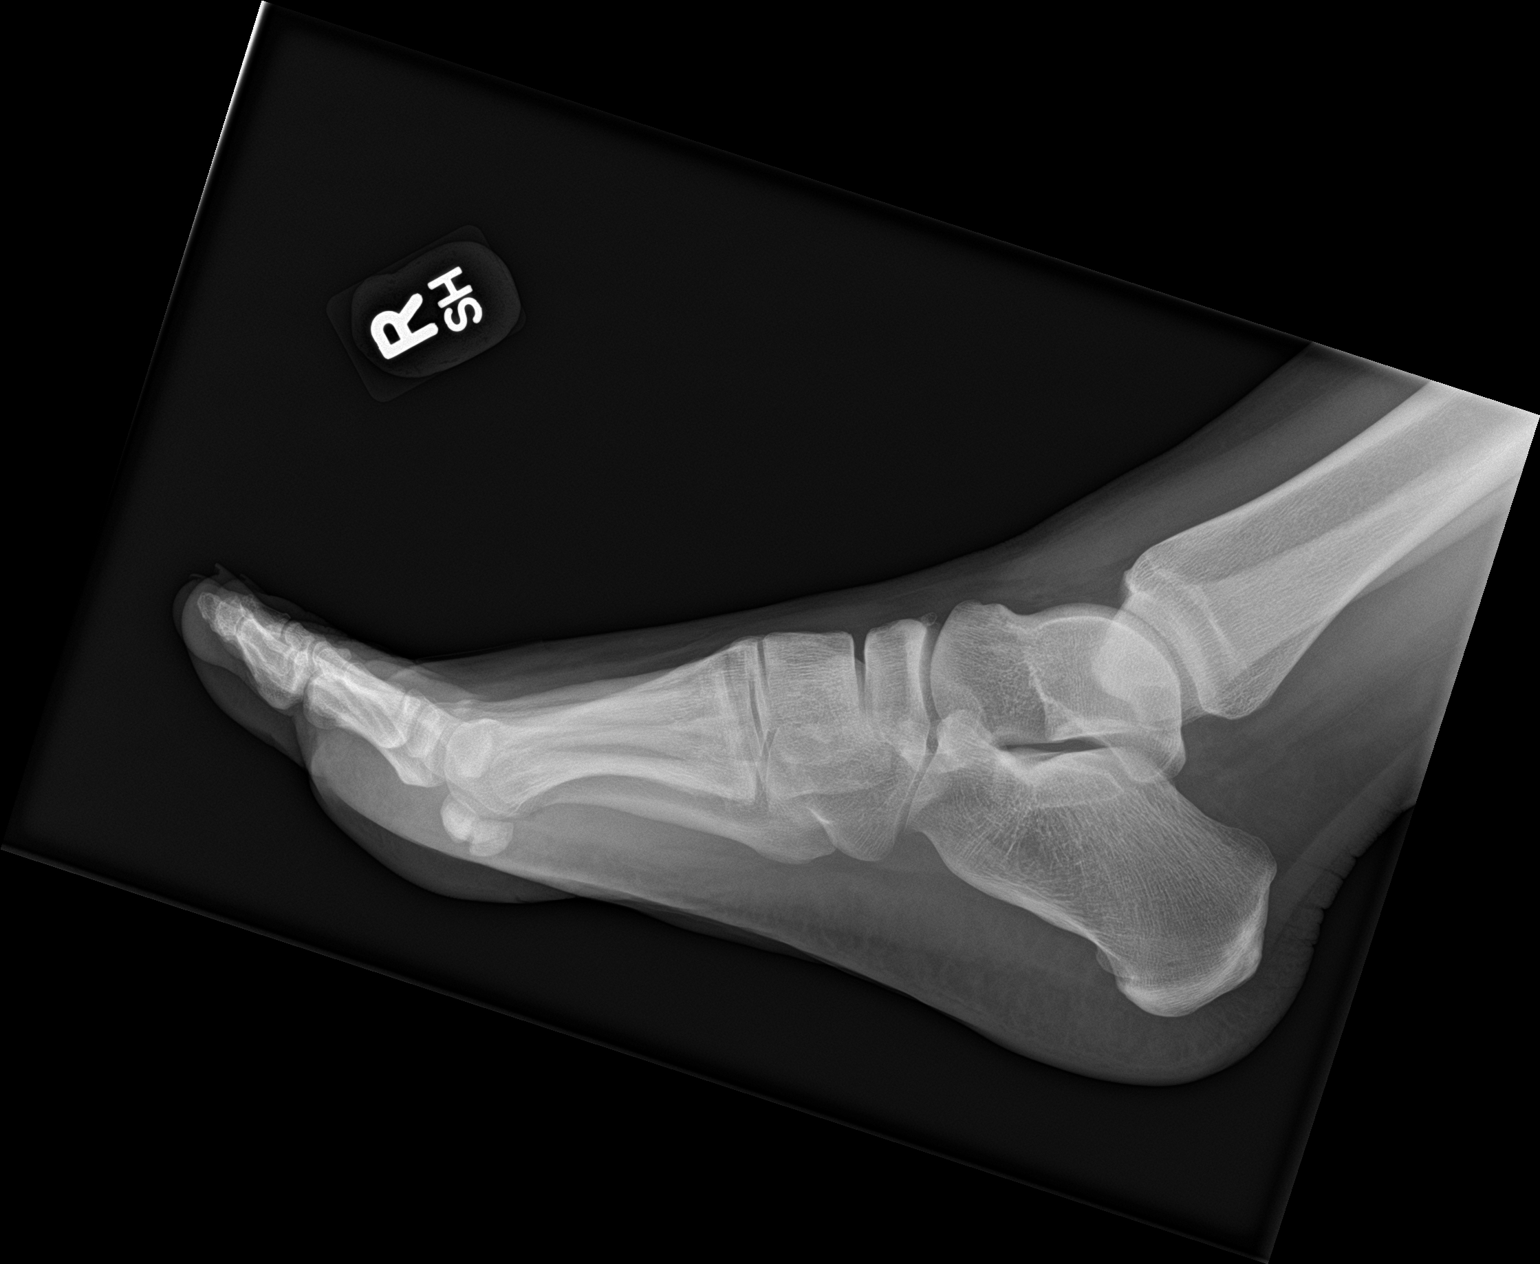

[3 of 3 positions shown; findings below may reference images not displayed]

FINDINGS: There is no evidence of fracture or dislocation. There is no
evidence of arthropathy or other focal bone abnormality. Soft
tissues are unremarkable.
IMPRESSION: Negative.

## 2020-11-21 ENCOUNTER — Other Ambulatory Visit: Payer: Self-pay

## 2020-11-21 ENCOUNTER — Ambulatory Visit (INDEPENDENT_AMBULATORY_CARE_PROVIDER_SITE_OTHER): Payer: Self-pay | Admitting: *Deleted

## 2020-11-21 DIAGNOSIS — T148XXA Other injury of unspecified body region, initial encounter: Secondary | ICD-10-CM

## 2020-11-21 DIAGNOSIS — B351 Tinea unguium: Secondary | ICD-10-CM

## 2020-11-21 NOTE — Progress Notes (Signed)
Patient presents for the 4th EPAT treatment today with complaint of sub/medial 1st MPJ pain right. Diagnosed with capsulitis/sesamoiditis by Dr. Jacqualyn Posey. This has been ongoing for several months. The patient has tried ice, stretching, NSAIDS and supportive shoe gear with no long term relief.   Most of the pain is located sub/medial 1st MPJ right. She says the the sharp pain is better, but still feeling a dull ache everyday. She is now experiencing some pain around the 5th met base right.  ESWT administered and tolerated well.Treatment settings initiated at:   Energy: 25  Ended treatment session today with 3000 shocks at the following settings:   Energy: 25  Frequency: 4.0  Joules: 24.44  *I did 2000 shocks sub 1st MPJ and 1000 shocks around the 5th met base   Reviewed post EPAT instructions. Advised to avoid ice and NSAIDs throughout the treatment process and to utilize boot or supportive shoes for at least the next 3 days.  Follow up with Dr. Jacqualyn Posey in 2 weeks for re-evaluation.

## 2020-12-05 ENCOUNTER — Ambulatory Visit (INDEPENDENT_AMBULATORY_CARE_PROVIDER_SITE_OTHER): Payer: 59 | Admitting: Podiatry

## 2020-12-05 ENCOUNTER — Other Ambulatory Visit: Payer: Self-pay

## 2020-12-05 ENCOUNTER — Encounter: Payer: Self-pay | Admitting: Podiatry

## 2020-12-05 DIAGNOSIS — T148XXA Other injury of unspecified body region, initial encounter: Secondary | ICD-10-CM

## 2020-12-05 DIAGNOSIS — M7751 Other enthesopathy of right foot: Secondary | ICD-10-CM

## 2020-12-10 NOTE — Progress Notes (Signed)
Subjective: 35 year old female presents the office today for follow-up evaluation of right foot pain. She has been doing EPAT and felt that it has been helpful but she still having some residual discomfort.  Also she has been starting get some pain to lateral aspect the foot as well but has not had any recent injury or trauma or any changes.  She is not sure if the orthotics or if it is from compensation.  No increase in swelling.  No other concerns.   Objective: AAO x3, NAD DP/PT pulses palpable bilaterally, CRT less than 3 seconds Right foot: There is still some residual discomfort on medial first MPJ and with MPJ range of motion.  There is no significant pain on sesamoids.  No significant edema, erythema.  She does get some discomfort of the fifth metatarsal base the fifth metatarsal head but no specific area of tenderness identified today.  There is no edema, erythema.  Flexor, extensor tendons appear to be intact.  MMT 5/5. No pain with calf compression, swelling, warmth, erythema  Assessment: 35 year old female chronic right first MPJ pain; lateral foot pain  Plan: -All treatment options discussed with the patient including all alternatives, risks, complications.  -At this point she has made some progress but still in some discomfort.  Discussed other treatment options.  Reviewed how to do a couple more treatments of EPAT as this has been helpful.  Discussed with her if no improvement consider PRP injection.  She is open to that as well. -I added a lateral post to her orthotic.  Vivi Barrack DPM  *I spoke with our scheduler, Marchelle Folks and she is in contact the patient for an appointment for EPAT

## 2020-12-22 ENCOUNTER — Ambulatory Visit (INDEPENDENT_AMBULATORY_CARE_PROVIDER_SITE_OTHER): Payer: 59

## 2020-12-22 ENCOUNTER — Other Ambulatory Visit: Payer: Self-pay

## 2020-12-22 DIAGNOSIS — T148XXA Other injury of unspecified body region, initial encounter: Secondary | ICD-10-CM

## 2020-12-22 NOTE — Progress Notes (Signed)
Patient presents for the 5th EPAT treatment today with complaint of sub/medial 1st MPJ pain right. Diagnosed with capsulitis/sesamoiditis by Dr. Jacqualyn Posey. This has been ongoing for several months. The patient has tried ice, stretching, NSAIDS and supportive shoe gear with no long term relief.   Most of the pain is located sub/medial 1st MPJ right. She says the the sharp pain is better, but still feeling a dull ache everyday. She is now experiencing some pain around the 5th met base right.  ESWT administered and tolerated well.Treatment settings initiated at:   Energy: 30  Ended treatment session today with 3000 shocks at the following settings:   Energy: 30  Frequency: 4.0  Joules: 29.43  *I did 2000 shocks sub 1st MPJ and 1000 shocks around the 5th met base   Reviewed post EPAT instructions. Advised to avoid ice and NSAIDs throughout the treatment process and to utilize boot or supportive shoes for at least the next 3 days.  Follow up with Dr. Jacqualyn Posey in 6 weeks for re-evaluation.

## 2021-01-23 ENCOUNTER — Ambulatory Visit (INDEPENDENT_AMBULATORY_CARE_PROVIDER_SITE_OTHER): Payer: 59 | Admitting: *Deleted

## 2021-01-23 ENCOUNTER — Other Ambulatory Visit: Payer: Self-pay

## 2021-01-23 DIAGNOSIS — T148XXA Other injury of unspecified body region, initial encounter: Secondary | ICD-10-CM

## 2021-01-23 NOTE — Progress Notes (Signed)
Patient presents for the 6th EPAT treatment today with complaint of sub/medial 1st MPJ pain right. Diagnosed with capsulitis/sesamoiditis by Dr. Ardelle Anton. This has been ongoing for several months. The patient has tried ice, stretching, NSAIDS and supportive shoe gear with no long term relief.   Most of the pain is located sub/medial 1st MPJ right. She states it does feel better, but still having some discomfort. The lateral foot pain is better.   ESWT administered and tolerated well.Treatment settings initiated at:   Energy: 35  Ended treatment session today with 3000 shocks at the following settings:   Energy: 35  Frequency: 4.0  Joules: 34.89   Reviewed post EPAT instructions. Advised to avoid ice and NSAIDs throughout the treatment process and to utilize boot or supportive shoes for at least the next 3 days.  Follow up with Dr. Ardelle Anton in 2 weeks. She is taking a trip to [place] in a few weeks as well, so she will call once she returns to schedule that appointment.

## 2021-02-12 ENCOUNTER — Ambulatory Visit: Payer: 59 | Admitting: Podiatry

## 2021-04-07 ENCOUNTER — Telehealth: Payer: Self-pay | Admitting: Sports Medicine

## 2021-04-07 ENCOUNTER — Encounter: Payer: Self-pay | Admitting: Podiatry

## 2021-04-07 ENCOUNTER — Other Ambulatory Visit: Payer: Self-pay | Admitting: Sports Medicine

## 2021-04-07 DIAGNOSIS — S86892A Other injury of other muscle(s) and tendon(s) at lower leg level, left leg, initial encounter: Secondary | ICD-10-CM

## 2021-04-07 NOTE — Telephone Encounter (Signed)
-----   Message from Vivi Barrack, DPM sent at 04/07/2021  5:23 PM EST ----- Dr. Karie Schwalbe,  I have been seeing Megan Willis and she is continuing to have pain. We have tried traditional treatments and I even tried a shockwave treatment to see if that would help, which it has not. What do you think about trying a PRP injection into the 1st MTPJ/plantar plate? If you think it would be useful could you do it for her? I wanted to touch base before I referred her back for this.   Thanks! Ouida Sills

## 2021-04-07 NOTE — Telephone Encounter (Signed)
Please advise 

## 2021-05-04 ENCOUNTER — Telehealth: Payer: Self-pay | Admitting: Podiatry

## 2021-05-04 NOTE — Telephone Encounter (Signed)
I called the patient to go over treatment options. No answer and left VM. Left office number to call back or if there is a good time to call her to let me know.   Of note I also called her on Friday 2/3 around 5:45 pm and left VM.   I have reviewed her case at our monthly case review and wanted to go over treatment options.

## 2021-05-06 ENCOUNTER — Telehealth: Payer: Self-pay | Admitting: Podiatry

## 2021-05-06 NOTE — Telephone Encounter (Signed)
Patient states she is returning a call to Dr Ardelle Anton she is only available for calls between 3pm and 530p  . Please call within that time frame

## 2021-05-08 NOTE — Telephone Encounter (Signed)
Patient scheduled for 05/12/2021 at 215pm. Thank you

## 2021-05-12 ENCOUNTER — Ambulatory Visit (INDEPENDENT_AMBULATORY_CARE_PROVIDER_SITE_OTHER): Payer: 59

## 2021-05-12 ENCOUNTER — Other Ambulatory Visit: Payer: Self-pay

## 2021-05-12 ENCOUNTER — Ambulatory Visit (INDEPENDENT_AMBULATORY_CARE_PROVIDER_SITE_OTHER): Payer: 59 | Admitting: Podiatry

## 2021-05-12 DIAGNOSIS — M7751 Other enthesopathy of right foot: Secondary | ICD-10-CM | POA: Diagnosis not present

## 2021-05-12 DIAGNOSIS — S9031XA Contusion of right foot, initial encounter: Secondary | ICD-10-CM

## 2021-05-12 DIAGNOSIS — M21611 Bunion of right foot: Secondary | ICD-10-CM | POA: Diagnosis not present

## 2021-05-12 DIAGNOSIS — M21619 Bunion of unspecified foot: Secondary | ICD-10-CM

## 2021-05-12 NOTE — Patient Instructions (Signed)

## 2021-05-16 NOTE — Progress Notes (Signed)
Subjective: 36 year old female presents the office today for follow evaluation of chronic right first MPJ pain.  She states that is better than what it has been but she still gets discomfort she points along the medial aspect.  She states it feels bruised.  No recent injury or changes otherwise.  Objective: AAO x3, NAD DP/PT pulses palpable bilaterally, CRT less than 3 seconds There is tenderness directly upon the medial aspect of the right first MPJ.  Discomfort is noted along the area of a bony prominence noted on the proximal phalanx base of the medial first MPJ.  There is no pain or crepitation with MPJ range of motion.  No edema, erythema.  No crepitation with range of motion.  No hypermobility. No pain with calf compression, swelling, warmth, erythema  Assessment: Capsulitis first MPJ right side  Plan: -All treatment options discussed with the patient including all alternatives, risks, complications.  -Repeat weightbearing x-rays were obtained and reviewed.  Mild narrowing of the medial first metatarsal phalangeal joint.  Bipartite sesamoid.  Mild HAV.  Evidence of acute fracture. -Overall symptoms have improved but she is continue to have the discomfort.  Discussed a localized steroid injection on the area of the tenderness on medial first MPJ, proximal phalanx as well.  Hopefully this will give [place] some diagnostic as well as therapeutic effect.  Skin was prepped Betadine and mixture of 0.5 cc of dexamethasone phosphate, 0.5 cc of Marcaine plain was infiltrated into the area of tenderness without complications.  Postinjection care discussed.  Tolerated well.  Vivi Barrack DPM  -Patient encouraged to call the office with any questions, concerns, change in symptoms.

## 2021-05-19 ENCOUNTER — Encounter: Payer: Self-pay | Admitting: Podiatry

## 2021-06-19 ENCOUNTER — Encounter: Payer: Self-pay | Admitting: Podiatry

## 2021-06-19 NOTE — Telephone Encounter (Signed)
Please advise 

## 2021-07-03 NOTE — Telephone Encounter (Signed)
Megan Willis- can you please schedule her? Thanks! ?

## 2021-07-08 NOTE — Telephone Encounter (Signed)
Dawn- can you please schedule her an appt? ?

## 2021-07-17 ENCOUNTER — Ambulatory Visit (INDEPENDENT_AMBULATORY_CARE_PROVIDER_SITE_OTHER): Payer: 59 | Admitting: Podiatry

## 2021-07-17 ENCOUNTER — Encounter: Payer: Self-pay | Admitting: Podiatry

## 2021-07-17 DIAGNOSIS — M7751 Other enthesopathy of right foot: Secondary | ICD-10-CM | POA: Diagnosis not present

## 2021-07-17 DIAGNOSIS — M21619 Bunion of unspecified foot: Secondary | ICD-10-CM

## 2021-07-21 NOTE — Progress Notes (Signed)
Subjective: ?36 year old female presents the office today for follow evaluation of chronic right first MPJ pain.  She states that over the last 1.5 weeks she has not noticed significant discomfort to the area.  She is the injection was painful with first week but after that it seemed to help.  No recent injury or changes otherwise since I last saw her no new concerns.  ? ?Objective: ?AAO x3, NAD ?DP/PT pulses palpable bilaterally, CRT less than 3 seconds ?There is mild tenderness directly upon the medial aspect of the right first MPJ.  Overall area seems to be doing much better.  There is no pain or crepitation to be treated to motion.  There is no edema, erythema.  No other area discomfort.  MMT 5/5. ?No pain with calf compression, swelling, warmth, erythema ? ?Assessment: ?Capsulitis first MPJ right side ? ?Plan: ?-All treatment options discussed with the patient including all alternatives, risks, complications.  ?-Urgency presents for repeat steroid injection.  We discussed another injection as she was continue to have discomfort.  Although she was doing much better for she was hesitant to another injection after discussion she wants to proceed with this.  Skin was prepped with Betadine, alcohol and mixture of 0.5 cc of dexamethasone phosphate, 0.5 cc of Marcaine plain was infiltrated to the medial aspect of the first MPJ on the area of tenderness without complications.  Postinjection care discussed.  She tolerated well. ?-Continue supportive shoe gear ? ?Return if symptoms worsen or fail to improve. ? ?Vivi Barrack DPM ?

## 2021-08-28 ENCOUNTER — Encounter: Payer: Self-pay | Admitting: Podiatry

## 2021-09-24 ENCOUNTER — Ambulatory Visit (INDEPENDENT_AMBULATORY_CARE_PROVIDER_SITE_OTHER): Payer: 59 | Admitting: Podiatry

## 2021-09-24 ENCOUNTER — Encounter: Payer: Self-pay | Admitting: Podiatry

## 2021-09-24 DIAGNOSIS — M25871 Other specified joint disorders, right ankle and foot: Secondary | ICD-10-CM | POA: Diagnosis not present

## 2021-09-24 NOTE — Progress Notes (Signed)
Subjective:  Patient ID: Megan Willis, female    DOB: 1985-08-11,  MRN: [id number] HPI Chief Complaint  Patient presents with   Foot Pain    Follow up capsulitis 1st MPJ right - multiple treatments by Dr. Jacqualyn Posey including EPAT-not any better, would like a 2nd opinion    36 y.o. female presents with the above complaint.   ROS: Denies fever chills nausea vomiting muscle aches pains calf pain back pain chest pain shortness of breath.  Past Medical History:  Diagnosis Date   Colitis    Genital warts    No past surgical history on file.  Current Outpatient Medications:    azelastine (OPTIVAR) 0.05 % ophthalmic solution, PLEASE SEE ATTACHED FOR DETAILED DIRECTIONS, Disp: , Rfl:    BINAXNOW COVID-19 AG HOME TEST KIT, , Disp: , Rfl:    Calcium Carbonate-Vitamin D 600-400 MG-UNIT tablet, Take 1 tablet by mouth 2 (two) times daily., Disp: 60 tablet, Rfl: 11   HAILEY 1.5/30 1.5-30 MG-MCG tablet, Take by mouth., Disp: , Rfl:    ipratropium (ATROVENT) 0.03 % nasal spray, SMARTSIG:1 Spray(s) Both Nares Daily PRN, Disp: , Rfl:    meloxicam (MOBIC) 15 MG tablet, Take 1 tablet (15 mg total) by mouth daily as needed for pain., Disp: 90 tablet, Rfl: 3   norethindrone-ethinyl estradiol (TRIPHASIL,CYCLAFEM,ALYACEN) 0.5/0.75/1-35 MG-MCG tablet, Take 1 tablet by mouth daily., Disp: , Rfl:    Olopatadine HCl 0.6 % SOLN, SMARTSIG:1-2 Spray(s) Both Nares Twice Daily PRN, Disp: , Rfl:    QSYMIA 3.75-23 MG CP24, Take 1 capsule by mouth daily., Disp: , Rfl:    SUMAtriptan (IMITREX) 50 MG tablet, Take by mouth., Disp: , Rfl:    SUMAtriptan (IMITREX) 50 MG tablet, Take by mouth., Disp: , Rfl:    triamcinolone (NASACORT) 55 MCG/ACT AERO nasal inhaler, Place 2 sprays into the nose daily., Disp: 1 Inhaler, Rfl: 11   valACYclovir (VALTREX) 1000 MG tablet, Take 2,000 mg by mouth., Disp: , Rfl:   No Known Allergies Review of Systems Objective:  There were no vitals filed for this visit.  General: Well developed,  nourished, in no acute distress, alert and oriented x3   Dermatological: Skin is warm, dry and supple bilateral. Nails x 10 are well maintained; remaining integument appears unremarkable at this time. There are no open sores, no preulcerative lesions, no rash or signs of infection present.  Vascular: Dorsalis Pedis artery and Posterior Tibial artery pedal pulses are 2/4 bilateral with immedate capillary fill time. Pedal hair growth present. No varicosities and no lower extremity edema present bilateral.   Neruologic: Grossly intact via light touch bilateral. Vibratory intact via tuning fork bilateral. Protective threshold with Semmes Wienstein monofilament intact to all pedal sites bilateral. Patellar and Achilles deep tendon reflexes 2+ bilateral. No Babinski or clonus noted bilateral.   Musculoskeletal: No gross boney pedal deformities bilateral. No pain, crepitus, or limitation noted with foot and ankle range of motion bilateral. Muscular strength 5/5 in all groups tested bilateral.  She has pain on palpation of the tibial sesamoid primarily of the first metatarsal phalangeal joint left foot no pain on palpation of the fibular sesamoid.  Gait: Unassisted, Nonantalgic.    Radiographs:  Radiographs and MRI were reviewed today radiographs from 2021 -2023 and 2 MRIs.  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  Plan: Discussed etiology pathology conservative surgical therapies at this point discussed excision 5 part tibial sesamoid right foot.  Discussed this in great detail went over possible side effects and complications associated with it he understands this is amenable to it she will notify me when she is ready  for surgery.     [facility] T. Abrams, [place]

## 2021-09-26 ENCOUNTER — Encounter: Payer: Self-pay | Admitting: Podiatry

## 2021-10-27 ENCOUNTER — Encounter: Payer: Self-pay | Admitting: Podiatry

## 2021-10-27 ENCOUNTER — Ambulatory Visit (INDEPENDENT_AMBULATORY_CARE_PROVIDER_SITE_OTHER): Payer: 59 | Admitting: Podiatry

## 2021-10-27 DIAGNOSIS — M25871 Other specified joint disorders, right ankle and foot: Secondary | ICD-10-CM | POA: Diagnosis not present

## 2021-10-27 NOTE — Progress Notes (Signed)
She presents today to discuss surgery for her sesamoids of her right foot.  She had seen Dr. Loreta Ave several occasions and myself on several occasions we have reviewed the MRIs the x-rays and decided that it is her bipartite tibial sesamoid that is most likely giving her the problem.  She is also stating that her left foot is starting to bother her to some degree.  She would like to consider surgical intervention regarding that right foot.  Objective: Vital signs are stable alert and oriented x3.  Pulses are palpable.  There is no erythema edema cellulitis drainage or odor she has pain on palpation to the tibial sesamoid and the suspensory ligament.  Assessment painful bipartite sesamoid with painful suspensory ligament medially.  Cannot rule out neuroma.  Plan: Discussed etiology pathology and surgical therapies at this point recommended surgical removal of the tibial sesamoid.  We did discuss the possible side effects complications which may include but not limited to postop pain bleeding swelling infection recurrence need for further surgery overcorrection under correction also digit loss limb loss of life loss of sensation.  She understands this is amenable to it we will follow-up with her in the surgery center in the near future.

## 2021-12-30 ENCOUNTER — Telehealth: Payer: Self-pay | Admitting: Urology

## 2021-12-30 NOTE — Telephone Encounter (Signed)
DOS - 01/29/22  TIBIAL SESAMOIDECTOMY RIGHT --- 17915  AETNA EFFECTIVE DATE - 05/29/2018  PER [employer]'S AUTOMATIVE SYSTEM FOR CPT CODE 05697 NO PRIOR AUTH IS REQUIRED.  REF # [id number]

## 2022-01-27 ENCOUNTER — Other Ambulatory Visit: Payer: Self-pay | Admitting: Podiatry

## 2022-01-27 MED ORDER — OXYCODONE-ACETAMINOPHEN 10-325 MG PO TABS: 1.0000 | ORAL_TABLET | Freq: Three times a day (TID) | ORAL | 0 refills | Status: AC | PRN

## 2022-01-27 MED ORDER — ONDANSETRON HCL 4 MG PO TABS: 4.0000 mg | ORAL_TABLET | Freq: Three times a day (TID) | ORAL | 0 refills | Status: DC | PRN

## 2022-01-27 MED ORDER — CEPHALEXIN 500 MG PO CAPS: 500.0000 mg | ORAL_CAPSULE | Freq: Three times a day (TID) | ORAL | 0 refills | Status: DC

## 2022-01-29 DIAGNOSIS — M25871 Other specified joint disorders, right ankle and foot: Secondary | ICD-10-CM | POA: Diagnosis not present

## 2022-02-04 ENCOUNTER — Ambulatory Visit (INDEPENDENT_AMBULATORY_CARE_PROVIDER_SITE_OTHER): Payer: 59

## 2022-02-04 ENCOUNTER — Encounter: Payer: 59 | Admitting: Podiatry

## 2022-02-04 DIAGNOSIS — M25871 Other specified joint disorders, right ankle and foot: Secondary | ICD-10-CM

## 2022-02-04 NOTE — Progress Notes (Signed)
Patient in the office today for POV #1 DOS 01/29/2022 EXCISION TIBIAL SESAMOIDECTOMY RT FOOT.   Patient denies nausea, vomiting, fever and chills. Patient also denies uncontrolled pain. Patient advised to exercise active ROM in the right hallux. Pulse in right foot palpable. Patient verbalized understanding.   Advised patient to continuing monitoring for signs and symptoms of infection, call the office immediately if any occur. Patient verbalized understanding.

## 2022-02-11 ENCOUNTER — Ambulatory Visit (INDEPENDENT_AMBULATORY_CARE_PROVIDER_SITE_OTHER): Payer: 59 | Admitting: Podiatry

## 2022-02-11 ENCOUNTER — Encounter: Payer: Self-pay | Admitting: Podiatry

## 2022-02-11 DIAGNOSIS — Z9889 Other specified postprocedural states: Secondary | ICD-10-CM

## 2022-02-11 DIAGNOSIS — M25871 Other specified joint disorders, right ankle and foot: Secondary | ICD-10-CM | POA: Diagnosis not present

## 2022-02-11 NOTE — Progress Notes (Signed)
She presents today for follow-up of excision tibial sesamoid right foot.  States that he feels just fine just a little bit of pressure.  Date of surgery 01/29/2022.  Denies fever chills nausea vomit muscle aches pains calf pain back pain chest pain shortness of breath.  Objective: Vital signs are stable alert oriented x3 distal dressing intact was removed demonstrates sutures are intact margins are well coapted she is got great range of motion actively and passively of the first metatarsophalangeal joint of the right foot.  Assessment: Well-healing surgical foot right.  Plan: Redressed today dressed a compressive dressing leave this on for another week cannot get it wet keep the foot elevated is much as possible and perform her exercises that we demonstrated today.  Have like to follow-up with her in a little more than a week for suture removal we did place her in a [place] shoe today.

## 2022-02-25 ENCOUNTER — Encounter: Payer: 59 | Admitting: Podiatry

## 2022-03-02 ENCOUNTER — Ambulatory Visit (INDEPENDENT_AMBULATORY_CARE_PROVIDER_SITE_OTHER): Payer: 59 | Admitting: Podiatry

## 2022-03-02 ENCOUNTER — Encounter: Payer: Self-pay | Admitting: Podiatry

## 2022-03-02 VITALS — BP 148/94 | HR 109

## 2022-03-02 DIAGNOSIS — M25871 Other specified joint disorders, right ankle and foot: Secondary | ICD-10-CM

## 2022-03-02 DIAGNOSIS — Z9889 Other specified postprocedural states: Secondary | ICD-10-CM

## 2022-03-02 NOTE — Progress Notes (Signed)
She presents today for a postop visit #3 date of surgery 01/29/2022 excision tibial sesamoid right foot.  States that is just a little sensitive along the incision.  She denies fever chills nausea vomiting muscle aches pains.  Objective: Vital signs stable alert oriented x 3 good range of motion of the first metatarsal phalangeal joint of the right foot.  She also has tenderness along the suture line.  Sutures removed today margins remain well coapted.  Assessment: Well-healing surgical foot.  Plan: Sutures removed today and allow her to get back to regular shoe gear if possible.  Will follow-up with her in another 2 to 3 weeks to make sure she is doing well.  Questions or concerns she will notify us immediately.

## 2022-03-08 ENCOUNTER — Encounter: Payer: Self-pay | Admitting: Podiatry

## 2022-03-08 DIAGNOSIS — Z9889 Other specified postprocedural states: Secondary | ICD-10-CM

## 2022-03-08 DIAGNOSIS — M25871 Other specified joint disorders, right ankle and foot: Secondary | ICD-10-CM

## 2022-03-11 ENCOUNTER — Encounter: Payer: 59 | Admitting: Podiatry

## 2022-03-11 NOTE — Addendum Note (Signed)
Addended by: Lottie Rater E on: 03/11/2022 12:15 PM   Modules accepted: Orders

## 2022-03-16 ENCOUNTER — Encounter: Payer: Self-pay | Admitting: Physical Therapy

## 2022-03-16 ENCOUNTER — Other Ambulatory Visit: Payer: Self-pay

## 2022-03-16 ENCOUNTER — Ambulatory Visit: Payer: 59 | Attending: Podiatry | Admitting: Physical Therapy

## 2022-03-16 DIAGNOSIS — R262 Difficulty in walking, not elsewhere classified: Secondary | ICD-10-CM | POA: Insufficient documentation

## 2022-03-16 DIAGNOSIS — M25674 Stiffness of right foot, not elsewhere classified: Secondary | ICD-10-CM | POA: Insufficient documentation

## 2022-03-16 DIAGNOSIS — Z9889 Other specified postprocedural states: Secondary | ICD-10-CM | POA: Diagnosis not present

## 2022-03-16 DIAGNOSIS — M79671 Pain in right foot: Secondary | ICD-10-CM | POA: Insufficient documentation

## 2022-03-16 DIAGNOSIS — M25871 Other specified joint disorders, right ankle and foot: Secondary | ICD-10-CM | POA: Diagnosis not present

## 2022-03-16 DIAGNOSIS — M6281 Muscle weakness (generalized): Secondary | ICD-10-CM | POA: Insufficient documentation

## 2022-03-16 NOTE — Therapy (Signed)
OUTPATIENT PHYSICAL THERAPY LOWER EXTREMITY EVALUATION   Patient Name: Megan Willis MRN: [id number] DOB:[birth year], 36 y.o., female Today's Date: 03/16/2022  END OF SESSION:  PT End of Session - 03/16/22 0851     Visit Number 1    Number of Visits 8    Date for PT Re-Evaluation 05/11/22    Authorization Type [employer]    PT Start Time [phone]    PT Stop Time 0930    PT Time Calculation (min) 40 min    Activity Tolerance Patient tolerated treatment well    Behavior During Therapy [facility] for tasks assessed/performed             Past Medical History:  Diagnosis Date   Colitis    Genital warts    History reviewed. No pertinent surgical history. 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Shin Splints) 07/19/2018   Chronic bilateral low back pain without sciatica 09/18/2015   Plantar fasciitis, bilateral 07/28/2015   Genital warts 12/18/2013   History of colitis 02/27/2013   Oral contraceptive pill surveillance 03/15/2012   Recurrent cold sores 03/15/2012    PCP: Venia Minks  REFERRING PROVIDER: Elinor Parkinson, DPM  REFERRING DIAG: [id number] (ICD-10-CM) - Sesamoiditis of right foot Z98.890 (ICD-10-CM) - Status post right foot surgery  THERAPY DIAG:  Pain in right foot  Stiffness of right foot, not elsewhere classified  Muscle weakness (generalized)  Difficulty in walking, not elsewhere classified  Rationale for Evaluation and Treatment: Rehabilitation  ONSET DATE: 01/29/2022  SUBJECTIVE:   SUBJECTIVE STATEMENT: Pt reports she had surgery in November. Pt states things have been fine. Pt got stitches out early last week. Pt reports she is still  getting swollen but still can't tolerate wearing a shoe. Pt states she started to get pain again. Improved once she put on her post op shoe again. Pt reports most of pain used to be in the incision but this is no longer the case. Will get random sharp pains into the big toe and intermittent toe tingling. Pt is using compression sock.   PERTINENT HISTORY: date of surgery 01/29/2022 excision tibial sesamoid right foot  PAIN:  Are you having pain? No  PRECAUTIONS: None  WEIGHT BEARING RESTRICTIONS: No  FALLS:  Has patient fallen in last 6 months? No  LIVING ENVIRONMENT: Lives with: lives with their spouse Lives in: House/apartment Stairs: Yes: Internal: Flight of stairs steps; none Has following equipment at home: None  OCCUPATION: Works from home  PLOF: Independent  PATIENT GOALS: Transition out of post op shoe into real shoes  NEXT MD VISIT: This Thursday 12/21  OBJECTIVE:   DIAGNOSTIC FINDINGS: From 6/29 office visit:  radiographs from 2021 -2023 and 2 MRIs.  Evaluating these and the timeline demonstrates a bipartite tibial sesamoid with hypertrophy particularly on the plantar aspect also does demonstrate more of a separation as time has progressed from 2021-2023   PATIENT SURVEYS:  FOTO TBA  COGNITION: Overall cognitive status: Within functional limits for tasks assessed     SENSATION: Reports tingling in big toe  EDEMA:  Bruised along medial foot; edema noted great toe  MUSCLE LENGTH: WFL  POSTURE: weight shift left  PALPATION: Mildly TTP medial great  toe  LOWER EXTREMITY ROM:  Active ROM Right eval Left eval  Hip flexion    Hip extension    Hip abduction    Hip adduction    Hip internal rotation    Hip external rotation    Knee flexion    Knee extension    Ankle dorsiflexion [facility]  Ankle plantarflexion [facility] WFL  Ankle inversion [facility]  Ankle eversion [facility] WFL  Great toe extension ~20 deg    (Blank rows = not tested)  LOWER EXTREMITY  MMT:  MMT Right eval Left eval  Hip flexion    Hip extension    Hip abduction    Hip adduction    Hip internal rotation    Hip external rotation    Knee flexion    Knee extension    Ankle dorsiflexion 4+   Ankle plantarflexion 4   Ankle inversion 4+   Ankle eversion 4   Great toe flexion 3 (unable to scrunch towel)   Great toe extension 3+    (Blank rows = not tested)  LOWER EXTREMITY SPECIAL TESTS:  Did not assess  FUNCTIONAL TESTS:  SLS on R: 30 sec but with increased weight on lateral border of foot, instability noted with foot flat  GAIT: Distance walked: 25' Assistive device utilized: None Level of assistance: Complete Independence Comments: Places weight on lateral border of foot during weightbearing with increased supination noted, reduced toe off on R   [facility] Adult PT Treatment:                                                DATE: 03/16/22 Therapeutic Exercise: Toe yoga x 10 Toe splaying x 10 Towel scrunch x 10 Arch lifting x 10 Self Care: Self toe mobilizations and great toe stretches into flexion, ext, and abd     PATIENT EDUCATION:  Education details: Exam findings, POC, initial HEP Person educated: Patient Education method: Explanation, Demonstration, and Handouts Education comprehension: verbalized understanding, returned demonstration, and needs further education  HOME EXERCISE PROGRAM: Access Code: [email or web address] URL: [email or web address] Date: 03/16/2022 Prepared by: Vernon Prey April Kirstie Peri  Exercises - Toe Yoga - Alternating Great Toe and Lesser Toe Extension  - 1 x daily - 7 x weekly - 2 sets - 10 reps - Toe Spreading  - 1 x daily - 7 x weekly - 2 sets - 10 reps - Arch Lifting  - 1 x daily - 7 x weekly - 2 sets - 10 reps - Seated Toe Towel Scrunches  - 1 x daily - 7 x weekly - 2 sets - 10 reps - Seated Self Great Toe Stretch  - 1 x daily - 7 x weekly - 2 sets - 30 sec hold  ASSESSMENT:  CLINICAL IMPRESSION: Patient is a 36  y.o. F who was seen today for physical therapy evaluation and treatment s/p surgery on 01/29/2022 for R foot excision of tibial sesamoid. Assessment significant for decreased great toe ROM, edema, with tenderness to palpation and mild decrease in sensation. Pt demos gross foot muscle weakness and instability affecting gait and transition to normal shoes. Pt would benefit from PT to address these issues.   OBJECTIVE IMPAIRMENTS: Abnormal gait, decreased activity tolerance, decreased balance, decreased endurance, decreased mobility, difficulty walking, decreased strength, increased edema, increased fascial restrictions, increased muscle spasms, postural dysfunction, and pain.   ACTIVITY  LIMITATIONS: standing, transfers, locomotion level, and caring for others  PARTICIPATION LIMITATIONS: shopping, community activity, and yard work  PERSONAL FACTORS: Age, Past/current experiences, and Time since onset of injury/illness/exacerbation are also affecting patient's functional outcome.   REHAB POTENTIAL: Good  CLINICAL DECISION MAKING: Stable/uncomplicated  EVALUATION COMPLEXITY: Low   GOALS: Goals reviewed with patient? Yes  SHORT TERM GOALS: Target date: 04/13/2022  Pt will be ind with initial HEP Baseline: Goal status: INITIAL  2.  Pt will be able to demo at least 45 deg great toe extension for improved toe off during gait Baseline:  Goal status: INITIAL  3.  Pt will report at least 50% reduction in edema Baseline:  Goal status: INITIAL   LONG TERM GOALS: Target date: 05/11/2022   Pt will be ind with progression and advancement of HEP Baseline:  Goal status: INITIAL  2.  Pt will be able to demo normal reciprocal gait pattern in a normal shoe Baseline:  Goal status: INITIAL  3.  Pt will be able to amb at least 1000' ind for return to community amb Baseline:  Goal status: INITIAL  4.  Pt will be able to perform SLS on R foot x1 min to demo improved stability and  endurance Baseline:  Goal status: INITIAL    PLAN:  PT FREQUENCY: 1x/week  PT DURATION: 8 weeks  PLANNED INTERVENTIONS: Therapeutic exercises, Therapeutic activity, Neuromuscular re-education, Balance training, Gait training, Patient/Family education, Self Care, Joint mobilization, Stair training, Orthotic/Fit training, Aquatic Therapy, Dry Needling, Electrical stimulation, Cryotherapy, Moist heat, scar mobilization, Taping, Ionotophoresis 4mg /ml Dexamethasone, Manual therapy, and Re-evaluation  PLAN FOR NEXT SESSION: Assess response to HEP. Work on foot and ankle strengthening/stabilization. Consider use of foam.    Yazmine Sorey April Ma L Josia Cueva, PT 03/16/2022, 9:49 AM

## 2022-03-18 ENCOUNTER — Encounter: Payer: Self-pay | Admitting: Podiatry

## 2022-03-18 ENCOUNTER — Ambulatory Visit (INDEPENDENT_AMBULATORY_CARE_PROVIDER_SITE_OTHER): Payer: 59 | Admitting: Podiatry

## 2022-03-18 DIAGNOSIS — M25871 Other specified joint disorders, right ankle and foot: Secondary | ICD-10-CM

## 2022-03-18 DIAGNOSIS — Z9889 Other specified postprocedural states: Secondary | ICD-10-CM

## 2022-03-18 NOTE — Progress Notes (Signed)
She presents today for her fourth postop visit date of surgery 01/29/2022 excision tibial sesamoid right foot states that started PT this week and hopefully that will help with the tingling that seems to be helping some.  Objective: Vital signs stable she is alert and oriented x 3 she is got great passive range of motion active range of motion is somewhat stiff.  She has 1 small eschar that is the distal aspect of her incision the incision itself appears to be adhesed to the medial aspect of the first metatarsophalangeal capsule.  This is more than likely encompassing some of the nerves which is resulting in some of her tingling.  Assessment: Well-healing surgical foot.  Plan: She is going to continue with physical therapy hopefully that will help break down some scar tissue and gain more active range of motion.  I will follow-up with her once physical therapy is complete

## 2022-03-24 ENCOUNTER — Ambulatory Visit: Payer: 59

## 2022-03-24 DIAGNOSIS — M6281 Muscle weakness (generalized): Secondary | ICD-10-CM

## 2022-03-24 DIAGNOSIS — R262 Difficulty in walking, not elsewhere classified: Secondary | ICD-10-CM

## 2022-03-24 DIAGNOSIS — M79671 Pain in right foot: Secondary | ICD-10-CM | POA: Diagnosis not present

## 2022-03-24 DIAGNOSIS — M25674 Stiffness of right foot, not elsewhere classified: Secondary | ICD-10-CM

## 2022-03-24 NOTE — Therapy (Signed)
OUTPATIENT PHYSICAL THERAPY LOWER EXTREMITY TREATMENT   Patient Name: Megan Willis MRN: [id number] DOB:November 02, 1985, 36 y.o., female Today's Date: 03/24/2022  END OF SESSION:  PT End of Session - 03/24/22 0843     Visit Number 2    Number of Visits 8    Date for PT Re-Evaluation 05/11/22    PT Start Time 0845    PT Stop Time 0925    PT Time Calculation (min) 40 min    Activity Tolerance Patient tolerated treatment well    Behavior During Therapy [facility] for tasks assessed/performed             Past Medical History:  Diagnosis Date   Colitis    Genital warts    History reviewed. No pertinent surgical history. 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[place] Splints) 07/19/2018   Chronic bilateral low back pain without sciatica 09/18/2015   Plantar fasciitis, bilateral 07/28/2015   Genital warts 12/18/2013   History of colitis 02/27/2013   Oral contraceptive pill surveillance 03/15/2012   Recurrent cold sores 03/15/2012    PCP: Venia Minks  REFERRING PROVIDER: Elinor Parkinson, DPM  REFERRING DIAG: [phone] (ICD-10-CM) - Sesamoiditis of right foot Z98.890 (ICD-10-CM) - Status post right foot surgery  THERAPY DIAG:  Pain in right foot  Stiffness of right foot, not elsewhere classified  Muscle weakness (generalized)  Difficulty in walking, not elsewhere classified  Rationale for Evaluation and Treatment: Rehabilitation  ONSET DATE: 01/29/2022  SUBJECTIVE:   SUBJECTIVE STATEMENT: Patient reports she has been wearing regular shoes for the last week, states she has less swelling. Patient states she has been busy with the holiday and did not  have much time to review HEP. Patient states she continues to have tingling underneath big toe.  PERTINENT HISTORY: date of surgery 01/29/2022 excision tibial sesamoid right foot  PAIN:  Are you having pain? No  PRECAUTIONS: None  WEIGHT BEARING RESTRICTIONS: No  FALLS:  Has patient fallen in last 6 months? No  LIVING ENVIRONMENT: Lives with: lives with their spouse Lives in: House/apartment Stairs: Yes: Internal: Flight of stairs steps; none Has following equipment at home: None  OCCUPATION: Works from home  PLOF: Independent  PATIENT GOALS: Transition out of post op shoe into real shoes  NEXT MD VISIT: This Thursday 12/21  OBJECTIVE:   DIAGNOSTIC FINDINGS: From 6/29 office visit:  radiographs from 2021 -2023 and 2 MRIs.  Evaluating these and the timeline demonstrates a bipartite tibial sesamoid with hypertrophy particularly on the plantar aspect also does demonstrate more of a separation as time has progressed from 2021-2023   PATIENT SURVEYS:  FOTO TBA  COGNITION: Overall cognitive status: Within functional limits for tasks assessed     SENSATION: Reports tingling in big toe  EDEMA:  Bruised along medial foot; edema noted great toe  MUSCLE LENGTH: WFL  POSTURE: weight shift left  PALPATION: Mildly TTP medial great toe  LOWER EXTREMITY ROM:  Active ROM Right eval Left eval  Hip flexion    Hip extension    Hip abduction    Hip adduction    Hip internal rotation    Hip external rotation    Knee flexion  Knee extension    Ankle dorsiflexion [facility]  Ankle plantarflexion [facility] WFL  Ankle inversion [facility]  Ankle eversion [place] WFL  Great toe extension ~20 deg    (Blank rows = not tested)  LOWER EXTREMITY MMT:  MMT Right eval Left eval  Hip flexion    Hip extension    Hip abduction    Hip adduction    Hip internal rotation    Hip external rotation    Knee flexion    Knee extension    Ankle dorsiflexion 4+   Ankle plantarflexion 4    Ankle inversion 4+   Ankle eversion 4   Great toe flexion 3 (unable to scrunch towel)   Great toe extension 3+    (Blank rows = not tested)  LOWER EXTREMITY SPECIAL TESTS:  Did not assess  FUNCTIONAL TESTS:  SLS on R: 30 sec but with increased weight on lateral border of foot, instability noted with foot flat  GAIT: Distance walked: 25' Assistive device utilized: None Level of assistance: Complete Independence Comments: Places weight on lateral border of foot during weightbearing with increased supination noted, reduced toe off on R  [facility] Adult PT Treatment:                                                DATE: 03/24/2022 Therapeutic Exercise: Seated: Ankle circles/flex/ext x5 each way Ankle ABCs Toe yoga x15 Toe splaying x15 Foot doming x20 Individual toe lift/lower x10  Standing: Gastroc/soleus stretch 3x20" Staggered stance A/P weight shifting (R forward) --> L high knee up R SLB 3x30" Seated: Resisted 4-way ankle RTB 2x10 Elliptical cool down x (fwd/bkwd)   [facility] Adult PT Treatment:                                                DATE: 03/16/22 Therapeutic Exercise: Toe yoga x 10 Toe splaying x 10 Towel scrunch x 10 Arch lifting x 10 Self Care: Self toe mobilizations and great toe stretches into flexion, ext, and abd     PATIENT EDUCATION:  Education details: Progress HEP; alignment/weight bearing awareness on R foot Person educated: Patient Education method: Explanation, Demonstration, and Handouts Education comprehension: verbalized understanding, returned demonstration, and needs further education  HOME EXERCISE PROGRAM: Access Code: [email or web address] URL: [email or web address] Date: 03/24/2022 Prepared by: Carlynn Herald  Program Notes Perform after your surgery. Follow surgical precautions.  Exercises - Toe Yoga - Alternating Great Toe and Lesser Toe Extension  - 1 x daily - 7 x weekly - 2 sets - 10 reps - Toe Spreading  - 1 x daily - 7  x weekly - 2 sets - 10 reps - Arch Lifting  - 1 x daily - 7 x weekly - 2 sets - 10 reps - Seated Toe Towel Scrunches  - 1 x daily - 7 x weekly - 2 sets - 10 reps - Seated Calf Stretch with Strap  - 2 x daily - 7 x weekly - 2 sets - 20 hold - Long Sitting Ankle Plantar Flexion with Resistance  - 2 x daily - 7 x weekly - 2 sets - 10 reps - Long Sitting Ankle Dorsiflexion with Anchored Resistance  - 2 x daily - 7  x weekly - 2 sets - 10 reps - Long Sitting Ankle Inversion with Resistance  - 2 x daily - 7 x weekly - 2 sets - 10 reps - Long Sitting Ankle Eversion with Resistance  - 2 x daily - 7 x weekly - 2 sets - 10 reps - Seated Toe Raise  - 2 x daily - 7 x weekly - 2 sets - 10 reps - Seated Self Great Toe Stretch  - 1 x daily - 7 x weekly - 3 sets - 10 reps  ASSESSMENT:  CLINICAL IMPRESSION: Toe mobility exercises continued to progress overall foot mobility and intrinsic muscle strengthening. Ankle strengthening exercises introduced to progress ankle stability. Increased ankle pronation noted in single leg balance on R LE; verbal cues improved patient's proprioceptive awareness of ankle alignment, especially during cool down on elliptical.   OBJECTIVE IMPAIRMENTS: Abnormal gait, decreased activity tolerance, decreased balance, decreased endurance, decreased mobility, difficulty walking, decreased strength, increased edema, increased fascial restrictions, increased muscle spasms, postural dysfunction, and pain.   ACTIVITY LIMITATIONS: standing, transfers, locomotion level, and caring for others  PARTICIPATION LIMITATIONS: shopping, community activity, and yard work  PERSONAL FACTORS: Age, Past/current experiences, and Time since onset of injury/illness/exacerbation are also affecting patient's functional outcome.   REHAB POTENTIAL: Good  CLINICAL DECISION MAKING: Stable/uncomplicated  EVALUATION COMPLEXITY: Low   GOALS: Goals reviewed with patient? Yes  SHORT TERM GOALS: Target date:  04/13/2022  Pt will be ind with initial HEP Baseline: Goal status: INITIAL  2.  Pt will be able to demo at least 45 deg great toe extension for improved toe off during gait Baseline:  Goal status: INITIAL  3.  Pt will report at least 50% reduction in edema Baseline:  Goal status: INITIAL   LONG TERM GOALS: Target date: 05/11/2022   Pt will be ind with progression and advancement of HEP Baseline:  Goal status: INITIAL  2.  Pt will be able to demo normal reciprocal gait pattern in a normal shoe Baseline:  Goal status: INITIAL  3.  Pt will be able to amb at least 1000' ind for return to community amb Baseline:  Goal status: INITIAL  4.  Pt will be able to perform SLS on R foot x1 min to demo improved stability and endurance Baseline:  Goal status: INITIAL    PLAN:  PT FREQUENCY: 1x/week  PT DURATION: 8 weeks  PLANNED INTERVENTIONS: Therapeutic exercises, Therapeutic activity, Neuromuscular re-education, Balance training, Gait training, Patient/Family education, Self Care, Joint mobilization, Stair training, Orthotic/Fit training, Aquatic Therapy, Dry Needling, Electrical stimulation, Cryotherapy, Moist heat, scar mobilization, Taping, Ionotophoresis 4mg /ml Dexamethasone, Manual therapy, and Re-evaluation  PLAN FOR NEXT SESSION: Continue foot and ankle strengthening/stabilization. Ankle strategy, single leg balance activities (consider use of foam). Consider taping big toe once scab healed.    , PTA 03/24/2022, 9:31 AM

## 2022-04-02 ENCOUNTER — Ambulatory Visit: Payer: 59 | Attending: Podiatry

## 2022-04-02 DIAGNOSIS — M79671 Pain in right foot: Secondary | ICD-10-CM | POA: Insufficient documentation

## 2022-04-02 DIAGNOSIS — M25674 Stiffness of right foot, not elsewhere classified: Secondary | ICD-10-CM | POA: Diagnosis present

## 2022-04-02 DIAGNOSIS — M6281 Muscle weakness (generalized): Secondary | ICD-10-CM | POA: Diagnosis present

## 2022-04-02 DIAGNOSIS — R262 Difficulty in walking, not elsewhere classified: Secondary | ICD-10-CM | POA: Insufficient documentation

## 2022-04-02 NOTE — Therapy (Signed)
OUTPATIENT PHYSICAL THERAPY LOWER EXTREMITY TREATMENT   Patient Name: Megan Willis MRN: [id number] DOB:[birth year], 37 y.o., female Today's Date: 04/02/2022  END OF SESSION:  PT End of Session - 04/02/22 0850     Visit Number 3    Number of Visits 8    Date for PT Re-Evaluation 05/11/22    PT Start Time 0848    PT Stop Time 0933    PT Time Calculation (min) 45 min    Activity Tolerance Patient tolerated treatment well    Behavior During Therapy [facility] for tasks assessed/performed             Past Medical History:  Diagnosis Date   Colitis    Genital warts    History reviewed. No pertinent surgical history. 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[place]) 07/19/2018   Chronic bilateral low back pain without sciatica 09/18/2015   Plantar fasciitis, bilateral 07/28/2015   Genital warts 12/18/2013   History of colitis 02/27/2013   Oral contraceptive pill surveillance 03/15/2012   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 Patient states she has difficulty with soreness when standing up suddenly  or quick movements. Patient states she no longer wears post-op shoes and wears primarily sneakers or flip flops.  PERTINENT HISTORY: date of surgery 01/29/2022 excision tibial sesamoid right foot  PAIN:  Are you having pain? No  PRECAUTIONS: None  WEIGHT BEARING RESTRICTIONS: No  FALLS:  Has patient fallen in last 6 months? No  LIVING ENVIRONMENT: Lives with: lives with their spouse Lives in: House/apartment Stairs: Yes: Internal: Flight of stairs steps; none Has following equipment at home: None  OCCUPATION: Works from home  PLOF: [place]: Transition out of post op shoe into real shoes  NEXT MD VISIT: This Thursday 12/21  OBJECTIVE:   DIAGNOSTIC FINDINGS: From 6/29 office visit:  radiographs from 2021 -2023 and 2 MRIs.  Evaluating these and the timeline demonstrates a bipartite tibial sesamoid with hypertrophy particularly on the plantar aspect also does demonstrate more of a separation as time has progressed from 2021-2023   PATIENT SURVEYS:  FOTO TBA  COGNITION: Overall cognitive status: Within functional limits for tasks assessed     SENSATION: Reports tingling in big toe  EDEMA:  Bruised along medial foot; edema noted great toe  MUSCLE LENGTH: WFL  POSTURE: weight shift left  PALPATION: Mildly TTP medial great toe  LOWER EXTREMITY ROM:  Active ROM Right eval Left eval  Hip flexion    Hip extension    Hip abduction    Hip adduction    Hip internal rotation    Hip external rotation    Knee flexion    Knee extension  Ankle dorsiflexion [facility]  Ankle plantarflexion [facility] WFL  Ankle inversion [facility]  Ankle eversion [facility] WFL  Great toe extension ~20 deg    (Blank rows = not tested)  LOWER EXTREMITY MMT:  MMT Right eval Left eval  Hip flexion    Hip extension    Hip abduction    Hip adduction    Hip internal rotation    Hip external rotation    Knee flexion    Knee extension    Ankle dorsiflexion 4+   Ankle  plantarflexion 4   Ankle inversion 4+   Ankle eversion 4   Great toe flexion 3 (unable to scrunch towel)   Great toe extension 3+    (Blank rows = not tested)  LOWER EXTREMITY SPECIAL TESTS:  Did not assess  FUNCTIONAL TESTS:  SLS on R: 30 sec but with increased weight on lateral border of foot, instability noted with foot flat  GAIT: Distance walked: 25' Assistive device utilized: None Level of assistance: Complete Independence Comments: Places weight on lateral border of foot during weightbearing with increased supination noted, reduced toe off on R   [facility] Adult PT Treatment:                                                DATE: 04/02/2022 Therapeutic Exercise: Elliptical warm up x5 min Ankle ABCs Toe splaying 10x3" Toe yoga x10 Foot doming 10x3" Resisted 4-way ankle RTB 2x10 Seated R ankle DF 10x3" Therapeutic Activity: Standing weight shifting: lateral, A/P  R SLB 3x30" Marble pick-up with toes   [facility] Adult PT Treatment:                                                DATE: 03/24/2022 Therapeutic Exercise: Seated: Ankle circles/flex/ext x5 each way Ankle ABCs Toe yoga x15 Toe splaying x15 Foot doming x20 Individual toe lift/lower x10  Standing: Gastroc/soleus stretch 3x20" Staggered stance A/P weight shifting (R forward) --> L high knee up R SLB 3x30" Seated: Resisted 4-way ankle RTB 2x10 Elliptical cool down x 89min (fwd/bkwd)   PATIENT EDUCATION:  Education details: Proprioceptive awareness of R medial foot alignment Person educated: Patient Education method: Explanation, Demonstration, and Handouts Education comprehension: verbalized understanding, returned demonstration, and needs further education  HOME EXERCISE PROGRAM: Access Code: [email or web address] URL: [email or web address] Date: 04/02/2022 Prepared by: Helane Gunther Exercises - Toe Yoga - Alternating Great Toe and Lesser Toe Extension  - 1 x daily - 7 x weekly - 2 sets - 10 reps - Toe  Spreading  - 1 x daily - 7 x weekly - 2 sets - 10 reps - Arch Lifting  - 1 x daily - 7 x weekly - 2 sets - 10 reps - Seated Toe Towel Scrunches  - 1 x daily - 7 x weekly - 2 sets - 10 reps - Seated Calf Stretch with Strap  - 2 x daily - 7 x weekly - 2 sets - 20 hold - Long Sitting Ankle Plantar Flexion with Resistance  - 2 x daily - 7 x weekly - 2 sets - 10 reps - Long Sitting Ankle Dorsiflexion with Anchored Resistance  - 2 x daily - 7 x weekly - 2 sets - 10  reps - Long Sitting Ankle Inversion with Resistance  - 2 x daily - 7 x weekly - 2 sets - 10 reps - Long Sitting Ankle Eversion with Resistance  - 2 x daily - 7 x weekly - 2 sets - 10 reps - Seated Toe Raise  - 2 x daily - 7 x weekly - 2 sets - 10 reps - Seated Self Great Toe Stretch  - 1 x daily - 7 x weekly - 3 sets - 10 reps - Mini Squat with Weight Shift  - 1 x daily - 7 x weekly - 3 sets - 10 reps - Standing Weight Shift  - 1 x daily - 7 x weekly - 3 sets - 10 reps - Single Leg Stance with Support  - 1 x daily - 7 x weekly - 3 sets - 10 reps   ASSESSMENT:  CLINICAL IMPRESSION: Ankle supination exhibited during weight shifting in all directions. Cueing improved medial weightbearing in R foot in single leg stance. Strengthening of intrinsic foot muscles progressed with toe pick up of marbles.  OBJECTIVE IMPAIRMENTS: Abnormal gait, decreased activity tolerance, decreased balance, decreased endurance, decreased mobility, difficulty walking, decreased strength, increased edema, increased fascial restrictions, increased muscle spasms, postural dysfunction, and pain.   ACTIVITY LIMITATIONS: standing, transfers, locomotion level, and caring for others  PARTICIPATION LIMITATIONS: shopping, community activity, and yard work  PERSONAL FACTORS: Age, Past/current experiences, and Time since onset of injury/illness/exacerbation are also affecting patient's functional outcome.   REHAB POTENTIAL: Good  CLINICAL DECISION MAKING:  Stable/uncomplicated  EVALUATION COMPLEXITY: Low   GOALS: Goals reviewed with patient? Yes  SHORT TERM GOALS: Target date: 04/13/2022  Pt will be ind with initial HEP Baseline: Goal status: INITIAL  2.  Pt will be able to demo at least 45 deg great toe extension for improved toe off during gait Baseline:  Goal status: INITIAL  3.  Pt will report at least 50% reduction in edema Baseline:  Goal status: INITIAL   LONG TERM GOALS: Target date: 05/11/2022   Pt will be ind with progression and advancement of HEP Baseline:  Goal status: INITIAL  2.  Pt will be able to demo normal reciprocal gait pattern in a normal shoe Baseline:  Goal status: INITIAL  3.  Pt will be able to amb at least 1000' ind for return to community amb Baseline:  Goal status: INITIAL  4.  Pt will be able to perform SLS on R foot x1 min to demo improved stability and endurance Baseline:  Goal status: INITIAL    PLAN:  PT FREQUENCY: 1x/week  PT DURATION: 8 weeks  PLANNED INTERVENTIONS: Therapeutic exercises, Therapeutic activity, Neuromuscular re-education, Balance training, Gait training, Patient/Family education, Self Care, Joint mobilization, Stair training, Orthotic/Fit training, Aquatic Therapy, Dry Needling, Electrical stimulation, Cryotherapy, Moist heat, scar mobilization, Taping, Ionotophoresis 4mg /ml Dexamethasone, Manual therapy, and Re-evaluation  PLAN FOR NEXT SESSION: Continue foot and ankle strengthening/stabilization. Ankle strategy, single leg balance activities (consider use of foam). Consider taping big toe once scab healed.    , PTA 04/02/2022, 9:36 AM

## 2022-04-09 ENCOUNTER — Ambulatory Visit: Payer: 59

## 2022-04-09 DIAGNOSIS — M25674 Stiffness of right foot, not elsewhere classified: Secondary | ICD-10-CM

## 2022-04-09 DIAGNOSIS — R262 Difficulty in walking, not elsewhere classified: Secondary | ICD-10-CM

## 2022-04-09 DIAGNOSIS — M79671 Pain in right foot: Secondary | ICD-10-CM

## 2022-04-09 DIAGNOSIS — M6281 Muscle weakness (generalized): Secondary | ICD-10-CM

## 2022-04-09 NOTE — Therapy (Signed)
OUTPATIENT PHYSICAL THERAPY LOWER EXTREMITY TREATMENT   Patient Name: Megan Willis MRN: [id number] DOB:05-29-1985, 37 y.o., female Today's Date: 04/09/2022  END OF SESSION:  PT End of Session - 04/09/22 0851     Visit Number 4    Number of Visits 8    Date for PT Re-Evaluation 05/11/22    PT Start Time 0847    PT Stop Time 0930    PT Time Calculation (min) 43 min              Past Medical History:  Diagnosis Date   Colitis    Genital warts    History reviewed. No pertinent surgical history. 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[place]) 07/19/2018   Chronic bilateral low back pain without sciatica 09/18/2015   Plantar fasciitis, bilateral 07/28/2015   Genital warts 12/18/2013   History of colitis 02/27/2013   Oral contraceptive pill surveillance 03/15/2012   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 Patient states she walked her dogs this morning and is feeling sore along the top of her foot.   PERTINENT HISTORY: date of surgery 01/29/2022 excision tibial sesamoid right foot  PAIN:   Are you having pain? No  PRECAUTIONS: None  WEIGHT BEARING RESTRICTIONS: No  FALLS:  Has patient fallen in last 6 months? No  LIVING ENVIRONMENT: Lives with: lives with their spouse Lives in: House/apartment Stairs: Yes: Internal: Flight of stairs steps; none Has following equipment at home: None  OCCUPATION: Works from home  PLOF: Independent  PATIENT GOALS: Transition out of post op shoe into real shoes  NEXT MD VISIT: This Thursday 12/21  OBJECTIVE:   DIAGNOSTIC FINDINGS: From 6/29 office visit:  radiographs from 2021 -2023 and 2 MRIs.  Evaluating these and the timeline demonstrates a bipartite tibial sesamoid with hypertrophy particularly on the plantar aspect also does demonstrate more of a separation as time has progressed from 2021-2023   PATIENT SURVEYS:  FOTO TBA  COGNITION: Overall cognitive status: Within functional limits for tasks assessed     SENSATION: Reports tingling in big toe  EDEMA:  Bruised along medial foot; edema noted great toe  MUSCLE LENGTH: WFL  POSTURE: weight shift left  PALPATION: Mildly TTP medial great toe  LOWER EXTREMITY ROM:  Active ROM Right eval Left eval  Hip flexion    Hip extension    Hip abduction    Hip adduction    Hip internal rotation    Hip external rotation    Knee flexion    Knee extension    Ankle dorsiflexion [facility]  Ankle plantarflexion [facility]  Ankle inversion [facility]  Ankle eversion [facility] WFL  Great toe extension ~20 deg    (  Blank rows = not tested)  LOWER EXTREMITY MMT:  MMT Right eval Left eval  Hip flexion    Hip extension    Hip abduction    Hip adduction    Hip internal rotation    Hip external rotation    Knee flexion    Knee extension    Ankle dorsiflexion 4+   Ankle plantarflexion 4   Ankle inversion 4+   Ankle eversion 4   Great toe flexion 3 (unable to scrunch towel)   Great toe extension 3+    (Blank rows = not tested)  LOWER EXTREMITY SPECIAL TESTS:  Did not  assess  FUNCTIONAL TESTS:  SLS on R: 30 sec but with increased weight on lateral border of foot, instability noted with foot flat  GAIT: Distance walked: 25' Assistive device utilized: None Level of assistance: Complete Independence Comments: Places weight on lateral border of foot during weightbearing with increased supination noted, reduced toe off on R   [facility] Adult PT Treatment:                                                DATE: 04/09/2022 Therapeutic Exercise: Elliptical fwd/bkwd x 2 min each Ankle ABCs Resisted R inversion (figure 4) YTB 2x10 Resisted R eversion YTB 2x10 Alt big toe/pinky toe extensions x15 Foot doming 10x3" Staggered stance weight shifting with arm swing Resisted side stepping RTB looped at forefoot --> fwd/bkwd stepping RTB Heel raises x10 --> R SL heel raise x10 Squats on airex x10 R SLB 3x30"  R foot stretch    [facility] Adult PT Treatment:                                                DATE: 04/02/2022 Therapeutic Exercise: Elliptical warm up x5 min Ankle ABCs Toe splaying 10x3" Toe yoga x10 Foot doming 10x3" Resisted 4-way ankle RTB 2x10 Seated R ankle DF 10x3" Therapeutic Activity: Standing weight shifting: lateral, A/P  R SLB 3x30" Marble pick-up with toes   PATIENT EDUCATION:  Education details: Proprioceptive awareness of R medial foot alignment Person educated: Patient Education method: Explanation, Demonstration, and Handouts Education comprehension: verbalized understanding, returned demonstration, and needs further education  HOME EXERCISE PROGRAM: Access Code: [email or web address] URL: [email or web address] Date: 04/02/2022 Prepared by: Helane Gunther Exercises - Toe Yoga - Alternating Great Toe and Lesser Toe Extension  - 1 x daily - 7 x weekly - 2 sets - 10 reps - Toe Spreading  - 1 x daily - 7 x weekly - 2 sets - 10 reps - Arch Lifting  - 1 x daily - 7 x weekly - 2 sets - 10 reps - Seated Toe Towel Scrunches  - 1 x daily -  7 x weekly - 2 sets - 10 reps - Seated Calf Stretch with Strap  - 2 x daily - 7 x weekly - 2 sets - 20 hold - Long Sitting Ankle Plantar Flexion with Resistance  - 2 x daily - 7 x weekly - 2 sets - 10 reps - Long Sitting Ankle Dorsiflexion with Anchored Resistance  - 2 x daily - 7 x weekly - 2 sets - 10 reps - Long Sitting Ankle Inversion with Resistance  - 2 x daily -  7 x weekly - 2 sets - 10 reps - Long Sitting Ankle Eversion with Resistance  - 2 x daily - 7 x weekly - 2 sets - 10 reps - Seated Toe Raise  - 2 x daily - 7 x weekly - 2 sets - 10 reps - Seated Self Great Toe Stretch  - 1 x daily - 7 x weekly - 3 sets - 10 reps - Mini Squat with Weight Shift  - 1 x daily - 7 x weekly - 3 sets - 10 reps - Standing Weight Shift  - 1 x daily - 7 x weekly - 3 sets - 10 reps - Single Leg Stance with Support  - 1 x daily - 7 x weekly - 3 sets - 10 reps   ASSESSMENT:  CLINICAL IMPRESSION: Medial resistance added during single leg balance improved ankle stability. Weight shifting activities continues to progress weight bearing tolerance in big toe and push off techniques. Single leg heel raises added to challenge ankle stability and strength.   OBJECTIVE IMPAIRMENTS: Abnormal gait, decreased activity tolerance, decreased balance, decreased endurance, decreased mobility, difficulty walking, decreased strength, increased edema, increased fascial restrictions, increased muscle spasms, postural dysfunction, and pain.   ACTIVITY LIMITATIONS: standing, transfers, locomotion level, and caring for others  PARTICIPATION LIMITATIONS: shopping, community activity, and yard work  PERSONAL FACTORS: Age, Past/current experiences, and Time since onset of injury/illness/exacerbation are also affecting patient's functional outcome.   REHAB POTENTIAL: Good  CLINICAL DECISION MAKING: Stable/uncomplicated  EVALUATION COMPLEXITY: Low   GOALS: Goals reviewed with patient? Yes  SHORT TERM GOALS: Target date:  04/13/2022  Pt will be ind with initial HEP Baseline: Goal status: INITIAL  2.  Pt will be able to demo at least 45 deg great toe extension for improved toe off during gait Baseline:  Goal status: INITIAL  3.  Pt will report at least 50% reduction in edema Baseline:  Goal status: INITIAL   LONG TERM GOALS: Target date: 05/11/2022   Pt will be ind with progression and advancement of HEP Baseline:  Goal status: INITIAL  2.  Pt will be able to demo normal reciprocal gait pattern in a normal shoe Baseline:  Goal status: INITIAL  3.  Pt will be able to amb at least 1000' ind for return to community amb Baseline:  Goal status: INITIAL  4.  Pt will be able to perform SLS on R foot x1 min to demo improved stability and endurance Baseline:  Goal status: INITIAL    PLAN:  PT FREQUENCY: 1x/week  PT DURATION: 8 weeks  PLANNED INTERVENTIONS: Therapeutic exercises, Therapeutic activity, Neuromuscular re-education, Balance training, Gait training, Patient/Family education, Self Care, Joint mobilization, Stair training, Orthotic/Fit training, Aquatic Therapy, Dry Needling, Electrical stimulation, Cryotherapy, Moist heat, scar mobilization, Taping, Ionotophoresis 4mg /ml Dexamethasone, Manual therapy, and Re-evaluation  PLAN FOR NEXT SESSION: Continue foot and ankle strengthening/stabilization. Ankle strategy, single leg balance activities (consider use of foam). Consider taping big toe once scab healed.    Hardin Negus, PTA 04/09/2022, 9:33 AM

## 2022-04-16 ENCOUNTER — Ambulatory Visit: Payer: 59

## 2022-04-16 DIAGNOSIS — M6281 Muscle weakness (generalized): Secondary | ICD-10-CM

## 2022-04-16 DIAGNOSIS — M79671 Pain in right foot: Secondary | ICD-10-CM | POA: Diagnosis not present

## 2022-04-16 DIAGNOSIS — M25674 Stiffness of right foot, not elsewhere classified: Secondary | ICD-10-CM

## 2022-04-16 DIAGNOSIS — R262 Difficulty in walking, not elsewhere classified: Secondary | ICD-10-CM

## 2022-04-16 NOTE — Therapy (Signed)
OUTPATIENT PHYSICAL THERAPY LOWER EXTREMITY TREATMENT   Patient Name: Megan Willis MRN: [id number] DOB:[birth year], 37 y.o., female Today's Date: 04/16/2022  END OF SESSION:  PT End of Session - 04/16/22 0847     Visit Number 5    Number of Visits 8    Date for PT Re-Evaluation 05/11/22    PT Start Time 0845    PT Stop Time 0928    PT Time Calculation (min) 43 min    Activity Tolerance Patient tolerated treatment well    Behavior During Therapy [facility] for tasks assessed/performed              Past Medical History:  Diagnosis Date   Colitis    Genital warts    History reviewed. No pertinent surgical history. 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[place]) 07/19/2018   Chronic bilateral low back pain without sciatica 09/18/2015   Plantar fasciitis, bilateral 07/28/2015   Genital warts 12/18/2013   History of colitis 02/27/2013   Oral contraceptive pill surveillance 03/15/2012   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 Patient states she has been waiting to walk dogs later in the day due to cold  temperatures.   PERTINENT HISTORY: date of surgery 01/29/2022 excision tibial sesamoid right foot  PAIN:  Are you having pain? No  PRECAUTIONS: None  WEIGHT BEARING RESTRICTIONS: No  FALLS:  Has patient fallen in last 6 months? No  LIVING ENVIRONMENT: Lives with: lives with their spouse Lives in: House/apartment Stairs: Yes: Internal: Flight of stairs steps; none Has following equipment at home: None  OCCUPATION: Works from home  PLOF: Independent  PATIENT GOALS: Transition out of post op shoe into real shoes  NEXT MD VISIT: This Thursday 12/21  OBJECTIVE:   DIAGNOSTIC FINDINGS: From 6/29 office visit:  radiographs from 2021 -2023 and 2 MRIs.  Evaluating these and the timeline demonstrates a bipartite tibial sesamoid with hypertrophy particularly on the plantar aspect also does demonstrate more of a separation as time has progressed from 2021-2023   PATIENT SURVEYS:  FOTO TBA  COGNITION: Overall cognitive status: Within functional limits for tasks assessed     SENSATION: Reports tingling in big toe  EDEMA:  Bruised along medial foot; edema noted great toe  MUSCLE LENGTH: WFL  POSTURE: weight shift left  PALPATION: Mildly TTP medial great toe  LOWER EXTREMITY ROM:  Active ROM Right eval Left eval  Hip flexion    Hip extension    Hip abduction    Hip adduction    Hip internal rotation    Hip external rotation    Knee flexion    Knee extension    Ankle dorsiflexion [facility]  Ankle plantarflexion  [facility] WFL  Ankle inversion [facility]  Ankle eversion [facility] WFL  Great toe extension ~20 deg    (Blank rows = not tested)  LOWER EXTREMITY MMT:  MMT Right eval Left eval  Hip flexion    Hip extension    Hip abduction    Hip adduction    Hip internal rotation    Hip external rotation    Knee flexion    Knee extension    Ankle dorsiflexion 4+   Ankle plantarflexion 4   Ankle inversion 4+   Ankle eversion 4   Great toe flexion 3 (unable to scrunch  towel)   Great toe extension 3+    (Blank rows = not tested)  LOWER EXTREMITY SPECIAL TESTS:  Did not assess  FUNCTIONAL TESTS:  SLS on R: 30 sec but with increased weight on lateral border of foot, instability noted with foot flat  GAIT: Distance walked: 25' Assistive device utilized: None Level of assistance: Complete Independence Comments: Places weight on lateral border of foot during weightbearing with increased supination noted, reduced toe off on R   [facility] Adult PT Treatment:                                                DATE: 04/16/2022 Therapeutic Exercise: Treadmill warm-up x 21min  2.1 mph, 0% -1% - 2% incline Toe yoga x20 Towel scrunches x20 Resisted inversion (seated figure 4) x20 Resisted eversion x20 R SLB on towel roll 3x30" Squats on bosu ball (platform) x10 R lunge push-off on bosu ball x15 R SLB bosu ball (ball side) 3x30" Gastroc stretvch on 4" step Heel raises + tennis ball b/w ankles 2x10 Wide step forward with focus on big toe push off Counter: Hydrants RTB 2x10 Side stepping RTB crossed at forefoot 2L Resisted marching RTB crossed at forefoot 2x10 Heel raises ER/IR 2x10 Great extension measurement (45 degrees)   [facility] Adult PT Treatment:                                                DATE: 04/09/2022 Therapeutic Exercise: Elliptical fwd/bkwd x 2 min each Ankle ABCs Resisted R inversion (figure 4) YTB 2x10 Resisted R eversion YTB 2x10 Alt big toe/pinky toe extensions x15 Foot doming 10x3" Staggered stance weight shifting with arm swing Resisted side stepping RTB looped at forefoot --> fwd/bkwd stepping RTB Heel raises x10 --> R SL heel raise x10 Squats on airex x10 R SLB 3x30"  R foot stretch  PATIENT EDUCATION:  Education details: Proprioceptive awareness of R medial foot alignment Person educated: Patient Education method: Explanation, Demonstration, and Handouts Education comprehension: verbalized understanding, returned demonstration,  and needs further education  HOME EXERCISE PROGRAM: Access Code: [email or web address] URL: [email or web address] Date: 04/02/2022 Prepared by: Helane Gunther Exercises - Toe Yoga - Alternating Great Toe and Lesser Toe Extension  - 1 x daily - 7 x weekly - 2 sets - 10 reps - Toe Spreading  - 1 x daily - 7 x weekly - 2 sets - 10 reps - Arch Lifting  - 1 x daily - 7 x weekly - 2 sets - 10 reps - Seated Toe Towel Scrunches  - 1 x daily - 7 x weekly - 2 sets -  10 reps - Seated Calf Stretch with Strap  - 2 x daily - 7 x weekly - 2 sets - 20 hold - Long Sitting Ankle Plantar Flexion with Resistance  - 2 x daily - 7 x weekly - 2 sets - 10 reps - Long Sitting Ankle Dorsiflexion with Anchored Resistance  - 2 x daily - 7 x weekly - 2 sets - 10 reps - Long Sitting Ankle Inversion with Resistance  - 2 x daily - 7 x weekly - 2 sets - 10 reps - Long Sitting Ankle Eversion with Resistance  - 2 x daily - 7 x weekly - 2 sets - 10 reps - Seated Toe Raise  - 2 x daily - 7 x weekly - 2 sets - 10 reps - Seated Self Great Toe Stretch  - 1 x daily - 7 x weekly - 3 sets - 10 reps - Mini Squat with Weight Shift  - 1 x daily - 7 x weekly - 3 sets - 10 reps - Standing Weight Shift  - 1 x daily - 7 x weekly - 3 sets - 10 reps - Single Leg Stance with Support  - 1 x daily - 7 x weekly - 3 sets - 10 reps   ASSESSMENT:  CLINICAL IMPRESSION: Ankle stability challenged with progression to single leg balance on bosu ball; mild ankle fatigue demonstrated on last round of single leg balance with increased ankle supination. Hip strengthening progressed with resisted marching and side stepping; resistance band crossed at forefoot to challenge postural alignment and glute strength. Patient able to perform 45 degrees of R big toe AROM extension. Patient reports 30% decrease in edema in foot since starting therapy.   OBJECTIVE IMPAIRMENTS: Abnormal gait, decreased activity tolerance, decreased balance, decreased endurance,  decreased mobility, difficulty walking, decreased strength, increased edema, increased fascial restrictions, increased muscle spasms, postural dysfunction, and pain.   ACTIVITY LIMITATIONS: standing, transfers, locomotion level, and caring for others  PARTICIPATION LIMITATIONS: shopping, community activity, and yard work  PERSONAL FACTORS: Age, Past/current experiences, and Time since onset of injury/illness/exacerbation are also affecting patient's functional outcome.   REHAB POTENTIAL: Good  CLINICAL DECISION MAKING: Stable/uncomplicated  EVALUATION COMPLEXITY: Low   GOALS: Goals reviewed with patient? Yes  SHORT TERM GOALS: Target date: 04/13/2022  Pt will be ind with initial HEP Baseline: Goal status: INITIAL 04/16/22: MET  2.  Pt will be able to demo at least 45 deg great toe extension for improved toe off during gait Baseline:  Goal status: INITIAL 04/16/2022: MET   3.  Pt will report at least 50% reduction in edema Baseline:  Goal status: INITIAL 04/16/2022: IN PROGRESS (30% reduction)   LONG TERM GOALS: Target date: 05/11/2022   Pt will be ind with progression and advancement of HEP Baseline:  Goal status: INITIAL  2.  Pt will be able to demo normal reciprocal gait pattern in a normal shoe Baseline:  Goal status: INITIAL  3.  Pt will be able to amb at least 1000' ind for return to community amb Baseline:  Goal status: INITIAL  4.  Pt will be able to perform SLS on R foot x1 min to demo improved stability and endurance Baseline:  Goal status: INITIAL    PLAN:  PT FREQUENCY: 1x/week  PT DURATION: 8 weeks  PLANNED INTERVENTIONS: Therapeutic exercises, Therapeutic activity, Neuromuscular re-education, Balance training, Gait training, Patient/Family education, Self Care, Joint mobilization, Stair training, Orthotic/Fit training, Aquatic Therapy, Dry Needling, Electrical stimulation, Cryotherapy, Moist heat, scar mobilization,  Taping, Ionotophoresis 4mg /ml  Dexamethasone, Manual therapy, and Re-evaluation  PLAN FOR NEXT SESSION: Continue foot and ankle strengthening/stabilization. Ankle strategy, single leg balance activities (consider use of foam). Consider taping big toe once scab healed.    Hardin Negus, PTA 04/16/2022, 9:28 AM

## 2022-04-23 ENCOUNTER — Ambulatory Visit: Payer: 59

## 2022-04-23 DIAGNOSIS — M79671 Pain in right foot: Secondary | ICD-10-CM

## 2022-04-23 DIAGNOSIS — M6281 Muscle weakness (generalized): Secondary | ICD-10-CM

## 2022-04-23 DIAGNOSIS — M25674 Stiffness of right foot, not elsewhere classified: Secondary | ICD-10-CM

## 2022-04-23 DIAGNOSIS — R262 Difficulty in walking, not elsewhere classified: Secondary | ICD-10-CM

## 2022-04-23 NOTE — Therapy (Signed)
OUTPATIENT PHYSICAL THERAPY LOWER EXTREMITY TREATMENT   Patient Name: Megan Willis MRN: [id number] DOB:10-02-1985, 37 y.o., female Today's Date: 04/23/2022  END OF SESSION:  PT End of Session - 04/23/22 0857     Visit Number 6    Number of Visits 8    Authorization Type [employer]    PT Start Time [phone]    PT Stop Time 0935    PT Time Calculation (min) 40 min              Past Medical History:  Diagnosis Date   Colitis    Genital warts    History reviewed. No pertinent surgical history. 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[place]) 07/19/2018   Chronic bilateral low back pain without sciatica 09/18/2015   Plantar fasciitis, bilateral 07/28/2015   Genital warts 12/18/2013   History of colitis 02/27/2013   Oral contraceptive pill surveillance 03/15/2012   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 Patient states swelling is down, states continued difficulty with great toe inversion. Patient reports 3/10 pain at base of great toe.   PERTINENT HISTORY: date of surgery 01/29/2022 excision tibial sesamoid right  foot  PAIN:  Are you having pain? No  PRECAUTIONS: None  WEIGHT BEARING RESTRICTIONS: No  FALLS:  Has patient fallen in last 6 months? No  LIVING ENVIRONMENT: Lives with: lives with their spouse Lives in: House/apartment Stairs: Yes: Internal: Flight of stairs steps; none Has following equipment at home: None  OCCUPATION: Works from home  PLOF: [place]: Transition out of post op shoe into real shoes  NEXT MD VISIT: This Thursday 12/21  OBJECTIVE:   DIAGNOSTIC FINDINGS: From 6/29 office visit:  radiographs from 2021 -2023 and 2 MRIs.  Evaluating these and the timeline demonstrates a bipartite tibial sesamoid with hypertrophy particularly on the plantar aspect also does demonstrate more of a separation as time has progressed from 2021-2023   PATIENT SURVEYS:  FOTO TBA  COGNITION: Overall cognitive status: Within functional limits for tasks assessed     SENSATION: Reports tingling in big toe  EDEMA:  Bruised along medial foot; edema noted great toe  MUSCLE LENGTH: WFL  POSTURE: weight shift left  PALPATION: Mildly TTP medial great toe  LOWER EXTREMITY ROM:  Active ROM Right eval Left eval  Hip flexion    Hip extension    Hip abduction    Hip adduction    Hip internal rotation    Hip external rotation    Knee flexion    Knee extension    Ankle dorsiflexion [facility]  Ankle plantarflexion [facility]  Ankle inversion [facility]  Ankle eversion [facility] WFL  Great toe extension ~20 deg    (  Blank rows = not tested)  LOWER EXTREMITY MMT:  MMT Right eval Left eval  Hip flexion    Hip extension    Hip abduction    Hip adduction    Hip internal rotation    Hip external rotation    Knee flexion    Knee extension    Ankle dorsiflexion 4+   Ankle plantarflexion 4   Ankle inversion 4+   Ankle eversion 4   Great toe flexion 3 (unable to scrunch towel)   Great toe extension 3+    (Blank rows = not tested)  LOWER EXTREMITY SPECIAL TESTS:   Did not assess  FUNCTIONAL TESTS:  SLS on R: 30 sec but with increased weight on lateral border of foot, instability noted with foot flat  GAIT: Distance walked: 25' Assistive device utilized: None Level of assistance: Complete Independence Comments: Places weight on lateral border of foot during weightbearing with increased supination noted, reduced toe off on R   [facility] Adult PT Treatment:                                                DATE: 04/23/2022 Therapeutic Exercise: Elliptical warm up x Toe yoga Towel scrunches/push out Lunges on bosu ball (no shoe)  R SLB (no shoe) 2x30" --> SLB on bosu ball 2x30" R eccentric heel raises 2x10 R SLB (no shoe) on towel roll 2x30" R SLB on towel roll + L hip abd 2x10 Bent knee side stepping BTB above 2x30" Pilates toe corrector: pull apart, point/flex Gait: Fwd amb (reciprocal gait pattern) Fwd slow march  Bkwd amb Bkwd slow march   [facility] Adult PT Treatment:                                                DATE: 04/16/2022 Therapeutic Exercise: Treadmill warm-up x  2.1 mph, 0% -1% - 2% incline Toe yoga x20 Towel scrunches x20 Resisted inversion (seated figure 4) x20 Resisted eversion x20 R SLB on towel roll 3x30" Squats on bosu ball (platform) x10 R lunge push-off on bosu ball x15 R SLB bosu ball (ball side) 3x30" Gastroc stretvch on 4" step Heel raises + tennis ball b/w ankles 2x10 Wide step forward with focus on big toe push off Counter: Hydrants RTB 2x10 Side stepping RTB crossed at forefoot 2L Resisted marching RTB crossed at forefoot 2x10 Heel raises ER/IR 2x10 Great extension measurement (45 degrees)   PATIENT EDUCATION:  Education details:Progressing HEP Person educated: Patient Education method: Programmer, multimedia, Facilities manager, and Handouts Education comprehension: verbalized understanding, returned demonstration, and needs further education  HOME EXERCISE PROGRAM: Access Code: [email or web address] URL:  [email or web address] Date: 04/02/2022 Prepared by: Carlynn Herald Exercises - Toe Yoga - Alternating Great Toe and Lesser Toe Extension  - 1 x daily - 7 x weekly - 2 sets - 10 reps - Toe Spreading  - 1 x daily - 7 x weekly - 2 sets - 10 reps - Arch Lifting  - 1 x daily - 7 x weekly - 2 sets - 10 reps - Seated Toe Towel Scrunches  - 1 x daily - 7 x weekly - 2 sets - 10 reps - Seated Calf Stretch with Strap  - 2 x daily -  7 x weekly - 2 sets - 20 hold - Long Sitting Ankle Plantar Flexion with Resistance  - 2 x daily - 7 x weekly - 2 sets - 10 reps - Long Sitting Ankle Dorsiflexion with Anchored Resistance  - 2 x daily - 7 x weekly - 2 sets - 10 reps - Long Sitting Ankle Inversion with Resistance  - 2 x daily - 7 x weekly - 2 sets - 10 reps - Long Sitting Ankle Eversion with Resistance  - 2 x daily - 7 x weekly - 2 sets - 10 reps - Seated Toe Raise  - 2 x daily - 7 x weekly - 2 sets - 10 reps - Seated Self Great Toe Stretch  - 1 x daily - 7 x weekly - 3 sets - 10 reps - Mini Squat with Weight Shift  - 1 x daily - 7 x weekly - 3 sets - 10 reps - Standing Weight Shift  - 1 x daily - 7 x weekly - 3 sets - 10 reps - Single Leg Stance with Support  - 1 x daily - 7 x weekly - 3 sets - 10 reps   ASSESSMENT:  CLINICAL IMPRESSION: Initial moderate ankle supination exhibited during backwards walking; verbal cueing improved ankle stability and proprioceptive awareness. Ankle strategy challenged with single leg balance on various compliant surfaces. Patient continues to report mild pain along plantar surface of great toe with push-off in walking and single leg balance activities.  OBJECTIVE IMPAIRMENTS: Abnormal gait, decreased activity tolerance, decreased balance, decreased endurance, decreased mobility, difficulty walking, decreased strength, increased edema, increased fascial restrictions, increased muscle spasms, postural dysfunction, and pain.   ACTIVITY LIMITATIONS: standing,  transfers, locomotion level, and caring for others  PARTICIPATION LIMITATIONS: shopping, community activity, and yard work  PERSONAL FACTORS: Age, Past/current experiences, and Time since onset of injury/illness/exacerbation are also affecting patient's functional outcome.   REHAB POTENTIAL: Good  CLINICAL DECISION MAKING: Stable/uncomplicated  EVALUATION COMPLEXITY: Low   GOALS: Goals reviewed with patient? Yes  SHORT TERM GOALS: Target date: 04/13/2022  Pt will be ind with initial HEP Baseline: Goal status: INITIAL 04/16/22: MET  2.  Pt will be able to demo at least 45 deg great toe extension for improved toe off during gait Baseline:  Goal status: INITIAL 04/16/2022: MET   3.  Pt will report at least 50% reduction in edema Baseline:  Goal status: INITIAL 04/16/2022: IN PROGRESS (30% reduction)   LONG TERM GOALS: Target date: 05/11/2022   Pt will be ind with progression and advancement of HEP Baseline:  Goal status: INITIAL  2.  Pt will be able to demo normal reciprocal gait pattern in a normal shoe Baseline:  Goal status: INITIAL  3.  Pt will be able to amb at least 1000' ind for return to community amb Baseline:  Goal status: INITIAL  4.  Pt will be able to perform SLS on R foot x1 min to demo improved stability and endurance Baseline:  Goal status: INITIAL    PLAN:  PT FREQUENCY: 1x/week  PT DURATION: 8 weeks  PLANNED INTERVENTIONS: Therapeutic exercises, Therapeutic activity, Neuromuscular re-education, Balance training, Gait training, Patient/Family education, Self Care, Joint mobilization, Stair training, Orthotic/Fit training, Aquatic Therapy, Dry Needling, Electrical stimulation, Cryotherapy, Moist heat, scar mobilization, Taping, Ionotophoresis 4mg /ml Dexamethasone, Manual therapy, and Re-evaluation  PLAN FOR NEXT SESSION: Continue foot and ankle strengthening/stabilization. Ankle strategy, single leg balance activities.     Hardin Negus,  PTA 04/23/2022, 9:43 AM

## 2022-04-30 ENCOUNTER — Ambulatory Visit: Payer: 59 | Attending: Podiatry

## 2022-04-30 DIAGNOSIS — M6281 Muscle weakness (generalized): Secondary | ICD-10-CM | POA: Insufficient documentation

## 2022-04-30 DIAGNOSIS — M79671 Pain in right foot: Secondary | ICD-10-CM | POA: Insufficient documentation

## 2022-04-30 DIAGNOSIS — R262 Difficulty in walking, not elsewhere classified: Secondary | ICD-10-CM | POA: Diagnosis present

## 2022-04-30 DIAGNOSIS — M25674 Stiffness of right foot, not elsewhere classified: Secondary | ICD-10-CM | POA: Insufficient documentation

## 2022-04-30 NOTE — Therapy (Signed)
OUTPATIENT PHYSICAL THERAPY LOWER EXTREMITY TREATMENT   Patient Name: Megan Willis MRN: [id number] DOB:1985/07/20, 37 y.o., female Today's Date: 04/30/2022  END OF SESSION:  PT End of Session - 04/30/22 0845     Visit Number 7    Number of Visits 8    Date for PT Re-Evaluation 05/11/22    Authorization Type [employer]    PT Start Time 0845    PT Stop Time 0930    PT Time Calculation (min) 45 min    Activity Tolerance Patient tolerated treatment well    Behavior During Therapy [facility] for tasks assessed/performed              Past Medical History:  Diagnosis Date   Colitis    Genital warts    History reviewed. No pertinent surgical history. 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Shin Splints) 07/19/2018   Chronic bilateral low back pain without sciatica 09/18/2015   Plantar fasciitis, bilateral 07/28/2015   Genital warts 12/18/2013   History of colitis 02/27/2013   Oral contraceptive pill surveillance 03/15/2012   Recurrent cold sores 03/15/2012    PCP: Zara Chess  REFERRING PROVIDER: Garrel Ridgel, DPM  REFERRING DIAG: [phone] (ICD-10-CM) - Sesamoiditis of right foot Z98.890 (ICD-10-CM) - Status post right foot surgery  THERAPY DIAG:  Pain in right foot  Stiffness of right foot, not elsewhere classified  Muscle weakness (generalized)  Difficulty in walking, not elsewhere classified  Rationale for Evaluation and Treatment: Rehabilitation  ONSET DATE: 01/29/2022  SUBJECTIVE:   SUBJECTIVE STATEMENT: Patient reports the scab on her toe is very tender today, states it has been really bothering her all week. Patient states she does  not have pain in the toe/foot, just discomfort from scab. Patient states she feels improvement with foot/ankle strength and stability.  PERTINENT HISTORY: date of surgery 01/29/2022 excision tibial sesamoid right foot  PAIN:  Are you having pain? No  PRECAUTIONS: None  WEIGHT BEARING RESTRICTIONS: No  FALLS:  Has patient fallen in last 6 months? No  LIVING ENVIRONMENT: Lives with: lives with their spouse Lives in: House/apartment Stairs: Yes: Internal: Flight of stairs steps; none Has following equipment at home: None  OCCUPATION: Works from home  PLOF: [place]: Transition out of post op shoe into real shoes  NEXT MD VISIT: This Thursday 12/21  OBJECTIVE:   DIAGNOSTIC FINDINGS: From 6/29 office visit:  radiographs from 2021 -2023 and 2 MRIs.  Evaluating these and the timeline demonstrates a bipartite tibial sesamoid with hypertrophy particularly on the plantar aspect also does demonstrate more of a separation as time has progressed from 2021-2023   PATIENT SURVEYS:  FOTO TBA  COGNITION: Overall cognitive status: Within functional limits for tasks assessed     SENSATION: Reports tingling in big toe  EDEMA:  Bruised along medial foot; edema noted great toe  MUSCLE LENGTH: WFL  POSTURE: weight shift left  PALPATION: Mildly TTP medial great toe  LOWER EXTREMITY ROM:  Active ROM Right eval Left eval  Hip flexion    Hip extension    Hip abduction    Hip adduction    Hip internal rotation    Hip external rotation  Knee flexion    Knee extension    Ankle dorsiflexion [facility]  Ankle plantarflexion [facility] WFL  Ankle inversion [facility]  Ankle eversion [facility] WFL  Great toe extension ~20 deg    (Blank rows = not tested)  LOWER EXTREMITY MMT:  MMT Right eval Left eval  Hip flexion    Hip extension    Hip abduction    Hip adduction    Hip internal rotation    Hip external rotation    Knee flexion    Knee extension    Ankle  dorsiflexion 4+   Ankle plantarflexion 4   Ankle inversion 4+   Ankle eversion 4   Great toe flexion 3 (unable to scrunch towel)   Great toe extension 3+    (Blank rows = not tested)  LOWER EXTREMITY SPECIAL TESTS:  Did not assess  FUNCTIONAL TESTS:  SLS on R: 30 sec but with increased weight on lateral border of foot, instability noted with foot flat  GAIT: Distance walked: 25' Assistive device utilized: None Level of assistance: Complete Independence Comments: Places weight on lateral border of foot during weightbearing with increased supination noted, reduced toe off on R    [facility] Adult PT Treatment:                                                DATE: 04/30/2022 Therapeutic Exercise: R SLB 3x30" R woodpecker x10 --> R trunk rotation 2x10 GTB at medial ankle: R SLS posterior lunge slides 2x10 R gastroc stretch on step  R SLB + alt trunk rotation R/L x10 --> cradling 10#KB x10 R SLB x1 min Treadmill amb x 1000' (1.8 mph) Therapeutic Activity: Assessment of big toe scab, discussion of contacting surgeon office to follow up on stitch/scab   [facility] Adult PT Treatment:                                                DATE: 04/23/2022 Therapeutic Exercise: Elliptical warm up x 82min Toe yoga Towel scrunches/push out Lunges on bosu ball (no shoe)  R SLB (no shoe) 2x30" --> SLB on bosu ball 2x30" R eccentric heel raises 2x10 R SLB (no shoe) on towel roll 2x30" R SLB on towel roll + L hip abd 2x10 Bent knee side stepping BTB above 2x30" Pilates toe corrector: pull apart, point/flex Gait: Fwd amb (reciprocal gait pattern) Fwd slow march  Bkwd amb Bkwd slow march   PATIENT EDUCATION:  Education details: Designer, fashion/clothing about stitch/scab Person educated: Patient Education method: Consulting civil engineer, Media planner, and Handouts Education comprehension: verbalized understanding, returned demonstration, and needs further education  HOME EXERCISE PROGRAM: Access Code:  [email or web address] URL: [email or web address] Date: 04/02/2022 Prepared by: Helane Gunther Exercises - Toe Yoga - Alternating Great Toe and Lesser Toe Extension  - 1 x daily - 7 x weekly - 2 sets - 10 reps - Toe Spreading  - 1 x daily - 7 x weekly - 2 sets - 10 reps - Arch Lifting  - 1 x daily - 7 x weekly - 2 sets - 10 reps - Seated Toe Towel Scrunches  - 1 x daily - 7 x weekly - 2 sets - 10 reps - Seated Calf Stretch with Strap  -  2 x daily - 7 x weekly - 2 sets - 20 hold - Long Sitting Ankle Plantar Flexion with Resistance  - 2 x daily - 7 x weekly - 2 sets - 10 reps - Long Sitting Ankle Dorsiflexion with Anchored Resistance  - 2 x daily - 7 x weekly - 2 sets - 10 reps - Long Sitting Ankle Inversion with Resistance  - 2 x daily - 7 x weekly - 2 sets - 10 reps - Long Sitting Ankle Eversion with Resistance  - 2 x daily - 7 x weekly - 2 sets - 10 reps - Seated Toe Raise  - 2 x daily - 7 x weekly - 2 sets - 10 reps - Seated Self Great Toe Stretch  - 1 x daily - 7 x weekly - 3 sets - 10 reps - Mini Squat with Weight Shift  - 1 x daily - 7 x weekly - 3 sets - 10 reps - Standing Weight Shift  - 1 x daily - 7 x weekly - 3 sets - 10 reps - Single Leg Stance with Support  - 1 x daily - 7 x weekly - 3 sets - 10 reps   ASSESSMENT:  CLINICAL IMPRESSION: Patient demonstrated good progression with foot strength and stability; improved medial foot stability dmeonstrated during single leg dynamic balance exercises. Good ankle strategy exhibited during prolonged single leg balance on R leg. Patietn demonstrated normal gait with no pain during prolonged ambulation on treadmill. Patient continues to have discomfort along scab/stitch site; recommended patient contact surgeon for follow-up.   OBJECTIVE IMPAIRMENTS: Abnormal gait, decreased activity tolerance, decreased balance, decreased endurance, decreased mobility, difficulty walking, decreased strength, increased edema, increased fascial restrictions,  increased muscle spasms, postural dysfunction, and pain.   ACTIVITY LIMITATIONS: standing, transfers, locomotion level, and caring for others  PARTICIPATION LIMITATIONS: shopping, community activity, and yard work  PERSONAL FACTORS: Age, Past/current experiences, and Time since onset of injury/illness/exacerbation are also affecting patient's functional outcome.   REHAB POTENTIAL: Good  CLINICAL DECISION MAKING: Stable/uncomplicated  EVALUATION COMPLEXITY: Low   GOALS: Goals reviewed with patient? Yes  SHORT TERM GOALS: Target date: 04/13/2022  Pt will be ind with initial HEP Baseline: Goal status: MET 04/16/22  2.  Pt will be able to demo at least 45 deg great toe extension for improved toe off during gait Baseline:  Goal status: MET 04/16/22  3.  Pt will report at least 50% reduction in edema Baseline:  Goal status: MET 04/30/22   LONG TERM GOALS: Target date: 05/11/2022   Pt will be ind with progression and advancement of HEP Baseline:  Goal status: MET 04/30/22  2.  Pt will be able to demo normal reciprocal gait pattern in a normal shoe Baseline:  Goal status: MET 04/30/22  3.  Pt will be able to amb at least 1000' ind for return to community amb Baseline:  Goal status: MET 04/30/22  4.  Pt will be able to perform SLS on R foot x1 min to demo improved stability and endurance Baseline:  Goal status: MET 04/30/22    PLAN:  PT FREQUENCY: 1x/week  PT DURATION: 8 weeks  PLANNED INTERVENTIONS: Therapeutic exercises, Therapeutic activity, Neuromuscular re-education, Balance training, Gait training, Patient/Family education, Self Care, Joint mobilization, Stair training, Orthotic/Fit training, Aquatic Therapy, Dry Needling, Electrical stimulation, Cryotherapy, Moist heat, scar mobilization, Taping, Ionotophoresis 4mg /ml Dexamethasone, Manual therapy, and Re-evaluation  PLAN FOR NEXT SESSION: Update & progress HEP; Discharge   Hardin Negus, PTA 04/30/2022, 9:32  AM

## 2022-05-12 ENCOUNTER — Encounter: Payer: Self-pay | Admitting: Podiatry

## 2022-05-12 ENCOUNTER — Ambulatory Visit (INDEPENDENT_AMBULATORY_CARE_PROVIDER_SITE_OTHER): Payer: 59 | Admitting: Podiatry

## 2022-05-12 DIAGNOSIS — M25871 Other specified joint disorders, right ankle and foot: Secondary | ICD-10-CM | POA: Diagnosis not present

## 2022-05-12 DIAGNOSIS — Z9889 Other specified postprocedural states: Secondary | ICD-10-CM | POA: Diagnosis not present

## 2022-05-12 NOTE — Progress Notes (Signed)
She presents today date of surgery 01/29/2022 excision tibial sesamoid states that she still has a little scab that is painful she states that physical therapy has pretty much gotten rid of all of her extraneous symptoms.  She has a little bit of numbness on the bottom of the toe but not much she says that is improving.  Objective: Vitals are stable alert oriented x 3 distal aspect of the medial incision along the first metatarsophalangeal joint does demonstrate a little callus.  Exquisitely tender on palpation.  I injected a small amount of lidocaine beneath the lesion and debrided it was Prolene stitch that was still intact.  This was resulting in her symptomatology.  Assessment: Well-healing surgical foot.  Plan: Removal of her elastics today with local anesthetic.  Follow-up with her as needed.

## 2022-05-14 ENCOUNTER — Encounter: Payer: Self-pay | Admitting: Physical Therapy

## 2022-05-14 ENCOUNTER — Ambulatory Visit: Payer: 59 | Admitting: Physical Therapy

## 2022-05-14 DIAGNOSIS — M79671 Pain in right foot: Secondary | ICD-10-CM | POA: Diagnosis not present

## 2022-05-14 DIAGNOSIS — M6281 Muscle weakness (generalized): Secondary | ICD-10-CM

## 2022-05-14 DIAGNOSIS — M25674 Stiffness of right foot, not elsewhere classified: Secondary | ICD-10-CM

## 2022-05-14 DIAGNOSIS — R262 Difficulty in walking, not elsewhere classified: Secondary | ICD-10-CM

## 2022-05-14 NOTE — Therapy (Signed)
OUTPATIENT PHYSICAL THERAPY LOWER EXTREMITY TREATMENT RECERT AND DISCHARGE   Patient Name: Megan Willis MRN: [id number] DOB:12-22-1985, 37 y.o., female Today's Date: 05/14/2022  END OF SESSION:  PT End of Session - 05/14/22 0913     Visit Number 8    Number of Visits 8    Date for PT Re-Evaluation 05/11/22    PT Start Time 0845    PT Stop Time 0913    PT Time Calculation (min) 28 min    Activity Tolerance Patient tolerated treatment well    Behavior During Therapy [facility] for tasks assessed/performed               Past Medical History:  Diagnosis Date   Colitis    Genital warts    History reviewed. No pertinent surgical history. 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[place] Spl[place]) 07/19/2018   Chronic bilateral low back pain without sciatica 09/18/2015   Plantar fasciitis, bilateral 07/28/2015   Genital warts 12/18/2013   History of colitis 02/27/2013   Oral contraceptive pill surveillance 03/15/2012   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 She states she feels  confident with her HEP and is ready to d/c today  PERTINENT HISTORY: date of surgery 01/29/2022 excision tibial sesamoid right foot  PAIN:  Are you having pain? No  PRECAUTIONS: None  WEIGHT BEARING RESTRICTIONS: No  FALLS:  Has patient fallen in last 6 months? No  LIVING ENVIRONMENT: Lives with: lives with their spouse Lives in: House/apartment Stairs: Yes: Internal: Flight of stairs steps; none Has following equipment at home: None  OCCUPATION: Works from home  PLOF: [place]: Transition out of post op shoe into real shoes  NEXT MD VISIT: This Thursday 12/21  OBJECTIVE:   DIAGNOSTIC FINDINGS: From 6/29 office visit:  radiographs from 2021 -2023 and 2 MRIs.  Evaluating these and the timeline demonstrates a bipartite tibial sesamoid with hypertrophy particularly on the plantar aspect also does demonstrate more of a separation as time has progressed from 2021-2023   PATIENT SURVEYS:  FOTO TBA  COGNITION: Overall cognitive status: Within functional limits for tasks assessed     SENSATION: Reports tingling in big toe  EDEMA:  Bruised along medial foot; edema noted great toe  MUSCLE LENGTH: WFL  POSTURE: weight shift left  PALPATION: Mildly TTP medial great toe  LOWER EXTREMITY ROM:  Active ROM Right eval Left eval  Hip flexion    Hip extension    Hip abduction    Hip adduction    Hip internal rotation    Hip external rotation    Knee flexion    Knee extension  Ankle dorsiflexion [facility]  Ankle plantarflexion [facility] WFL  Ankle inversion [facility]  Ankle eversion [facility] WFL  Great toe extension ~20 deg    (Blank rows = not tested)  LOWER EXTREMITY MMT:  MMT Right eval Left eval  Hip flexion    Hip extension    Hip abduction    Hip adduction    Hip internal rotation    Hip external rotation    Knee flexion    Knee extension    Ankle dorsiflexion 4+   Ankle plantarflexion 4   Ankle inversion 4+   Ankle eversion 4   Great  toe flexion 3 (unable to scrunch towel)   Great toe extension 3+    (Blank rows = not tested)  LOWER EXTREMITY SPECIAL TESTS:  Did not assess  FUNCTIONAL TESTS:  SLS on R: 30 sec but with increased weight on lateral border of foot, instability noted with foot flat  GAIT: Distance walked: 25' Assistive device utilized: None Level of assistance: Complete Independence Comments: Places weight on lateral border of foot during weightbearing with increased supination noted, reduced toe off on R    [facility] Adult PT Treatment:                                                DATE: 05/14/22 Therapeutic Exercise: Treadmill 1.8 mph x 5 min for warm up SL Heel raise 2 x 10 Woodpecker SL 2 x 10 Single leg floor touch x 10 SLS with trunk rotation 2 x 10 SL squat with red TB around thighs 2 x 10 bilat Soleus stretch 2 x 30 sec Gastroc stretch 2 x 30 sec     [facility] Adult PT Treatment:                                                DATE: 04/30/2022 Therapeutic Exercise: R SLB 3x30" R woodpecker x10 --> R trunk rotation 2x10 GTB at medial ankle: R SLS posterior lunge slides 2x10 R gastroc stretch on step  R SLB + alt trunk rotation R/L x10 --> cradling 10#KB x10 R SLB x1 min Treadmill amb x 1000' (1.8 mph) Therapeutic Activity: Assessment of big toe scab, discussion of contacting surgeon office to follow up on stitch/scab   [facility] Adult PT Treatment:                                                DATE: 04/23/2022 Therapeutic Exercise: Elliptical warm up x 32mn Toe yoga Towel scrunches/push out Lunges on bosu ball (no shoe)  R SLB (no shoe) 2x30" --> SLB on bosu ball 2x30" R eccentric heel raises 2x10 R SLB (no shoe) on towel roll 2x30" R SLB on towel roll + L hip abd 2x10 Bent knee side stepping BTB above 2x30" Pilates toe corrector: pull apart, point/flex Gait: Fwd amb (reciprocal gait pattern) Fwd slow march  Bkwd amb Bkwd slow march   PATIENT EDUCATION:  Education details:  CDesigner, fashion/clothingabout stitch/scab Person educated: Patient Education method: Explanation, DMedia planner and Handouts Education comprehension: verbalized understanding, returned demonstration, and needs further education  HOME EXERCISE PROGRAM: Access Code: [email or web address] URL: [email or web address] Date: 05/14/2022 Prepared by: Isabelle Course  Program Notes Perform after your surgery. Follow surgical precautions.  Exercises - Single Leg Heel Raise with Unilateral Counter Support  - 1 x daily - 7 x weekly - 3 sets - 10 reps - Woodpeckers One Leg  - 1 x daily - 7 x weekly - 3 sets - 10 reps - Single Leg Balance with Alternating Floor Reaches  - 1 x daily - 7 x weekly - 3 sets - 10 reps - Single Leg Balance with Trunk Rotation  - 1 x daily - 7 x weekly - 3 sets - 10 reps - Single Leg Partial Squat with Resistance  - 1 x daily - 7 x weekly - 3 sets - 10 reps - Standing Soleus Stretch  - 1 x daily - 7 x weekly - 3 sets - 10 reps - Standing Gastroc Stretch  - 1 x daily - 7 x weekly - 3 sets - 10 reps   ASSESSMENT:  CLINICAL IMPRESSION: Pt has achieved all goals. She is comfortable with HEP and ready for d/c   OBJECTIVE IMPAIRMENTS: Abnormal gait, decreased activity tolerance, decreased balance, decreased endurance, decreased mobility, difficulty walking, decreased strength, increased edema, increased fascial restrictions, increased muscle spasms, postural dysfunction, and pain.   ACTIVITY LIMITATIONS: standing, transfers, locomotion level, and caring for others  PARTICIPATION LIMITATIONS: shopping, community activity, and yard work  PERSONAL FACTORS: Age, Past/current experiences, and Time since onset of injury/illness/exacerbation are also affecting patient's functional outcome.   REHAB POTENTIAL: Good  CLINICAL DECISION MAKING: Stable/uncomplicated  EVALUATION COMPLEXITY: Low   GOALS: Goals reviewed with patient? Yes  SHORT TERM GOALS: Target date:  04/13/2022  Pt will be ind with initial HEP Baseline: Goal status: MET 04/16/22  2.  Pt will be able to demo at least 45 deg great toe extension for improved toe off during gait Baseline:  Goal status: MET 04/16/22  3.  Pt will report at least 50% reduction in edema Baseline:  Goal status: MET 04/30/22   LONG TERM GOALS: Target date: 05/11/2022   Pt will be ind with progression and advancement of HEP Baseline:  Goal status: MET 04/30/22  2.  Pt will be able to demo normal reciprocal gait pattern in a normal shoe Baseline:  Goal status: MET 04/30/22  3.  Pt will be able to amb at least 1000' ind for return to community amb Baseline:  Goal status: MET 04/30/22  4.  Pt will be able to perform SLS on R foot x1 min to demo improved stability and endurance Baseline:  Goal status: MET 04/30/22    PLAN:  PT FREQUENCY: 1x/week  PT DURATION: 8 weeks  PLANNED INTERVENTIONS: Therapeutic exercises, Therapeutic activity, Neuromuscular re-education, Balance training, Gait training, Patient/Family education, Self Care, Joint mobilization, Stair training, Orthotic/Fit training, Aquatic Therapy, Dry Needling, Electrical stimulation, Cryotherapy, Moist heat, scar mobilization, Taping, Ionotophoresis 70m/ml Dexamethasone, Manual therapy, and Re-evaluation  PLAN FOR NEXT SESSION: d/c PHYSICAL THERAPY DISCHARGE SUMMARY  Visits from Start of Care: 8  Current functional level related to goals / functional outcomes: Improved strength, balance, mobility   Remaining deficits: See above   Education / Equipment: HEP   Patient agrees to discharge. Patient goals were met. Patient is being discharged due to meeting the stated rehab goals.   Dontrey Snellgrove, PT 05/14/2022, 9:14 AM

## 2023-01-11 ENCOUNTER — Ambulatory Visit (INDEPENDENT_AMBULATORY_CARE_PROVIDER_SITE_OTHER): Payer: 59

## 2023-01-11 ENCOUNTER — Encounter: Payer: Self-pay | Admitting: Podiatry

## 2023-01-11 ENCOUNTER — Ambulatory Visit (INDEPENDENT_AMBULATORY_CARE_PROVIDER_SITE_OTHER): Payer: 59 | Admitting: Podiatry

## 2023-01-11 DIAGNOSIS — M7662 Achilles tendinitis, left leg: Secondary | ICD-10-CM

## 2023-01-11 DIAGNOSIS — M7750 Other enthesopathy of unspecified foot: Secondary | ICD-10-CM | POA: Diagnosis not present

## 2023-01-11 DIAGNOSIS — S93402A Sprain of unspecified ligament of left ankle, initial encounter: Secondary | ICD-10-CM | POA: Diagnosis not present

## 2023-01-11 DIAGNOSIS — M7751 Other enthesopathy of right foot: Secondary | ICD-10-CM | POA: Diagnosis not present

## 2023-01-11 MED ORDER — CELECOXIB 200 MG PO CAPS: 200.0000 mg | ORAL_CAPSULE | Freq: Two times a day (BID) | ORAL | 3 refills | Status: DC

## 2023-01-11 MED ORDER — METHYLPREDNISOLONE 4 MG PO TBPK: ORAL_TABLET | ORAL | 0 refills | Status: DC

## 2023-01-11 NOTE — Progress Notes (Signed)
She presents today after having fallen down the last couple of steps carrying her geriatric dog.  She states that she feels like she had an inversion of her ankle which was very tender for a few days and then was able to start walking on it and walk relatively normal.  She states that she has had some dry needling.  She states that the majority of the pain now is no longer in the anterior lateral ankle where she points to it but more in the Achilles area.  Objective: Vital signs are stable she is alert and oriented x 3 pulses are palpable.  She has no swelling in the right ankle anteriorly though she does have some tenderness on palpation of the anterior talofibular ligament ankle joint itself is stable with a negative anterior posterior drawer.  She does have tenderness on palpation of the Achilles and particularly with heel off with gait.  Radiographs taken today do not demonstrate any type of ankle abnormality though evaluating the posterior aspect of the ankle on the lateral view it does demonstrate some thickening of the Achilles just at the watershed area prior to insertion on the calcaneus.  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  Plan: She has a cam walker from her previous sesamoid surgery at home.  She will start wearing that.  I am going to place her on nonsteroidal anti-inflammatory to be followed by Celebrex 200 mg 1 twice daily #60 and 3 refills.  She was also dispensed a night splint and she will continue to ice.  I will follow-up with her when she returns from her trip to [place].

## 2023-03-08 ENCOUNTER — Encounter: Payer: Self-pay | Admitting: Podiatry

## 2023-03-08 MED ORDER — CELECOXIB 200 MG PO CAPS: 200.0000 mg | ORAL_CAPSULE | Freq: Two times a day (BID) | ORAL | 3 refills | Status: DC

## 2023-03-25 ENCOUNTER — Encounter: Payer: Self-pay | Admitting: Podiatry

## 2023-04-05 ENCOUNTER — Encounter: Payer: Self-pay | Admitting: Podiatry

## 2023-04-05 ENCOUNTER — Ambulatory Visit (INDEPENDENT_AMBULATORY_CARE_PROVIDER_SITE_OTHER): Payer: 59

## 2023-04-05 ENCOUNTER — Ambulatory Visit (INDEPENDENT_AMBULATORY_CARE_PROVIDER_SITE_OTHER): Payer: 59 | Admitting: Podiatry

## 2023-04-05 DIAGNOSIS — M25871 Other specified joint disorders, right ankle and foot: Secondary | ICD-10-CM

## 2023-04-05 DIAGNOSIS — M778 Other enthesopathies, not elsewhere classified: Secondary | ICD-10-CM | POA: Diagnosis not present

## 2023-04-05 DIAGNOSIS — S93622A Sprain of tarsometatarsal ligament of left foot, initial encounter: Secondary | ICD-10-CM | POA: Diagnosis not present

## 2023-04-05 MED ORDER — METHYLPREDNISOLONE 4 MG PO TBPK: ORAL_TABLET | ORAL | 0 refills | Status: DC

## 2023-04-05 MED ORDER — CELECOXIB 200 MG PO CAPS: 200.0000 mg | ORAL_CAPSULE | Freq: Two times a day (BID) | ORAL | 3 refills | Status: DC

## 2023-04-05 NOTE — Progress Notes (Signed)
 She presents today with a new problem midfoot pain left aching times the past 2 to 3 weeks she states that is worse with stairs but has improved to some degree.  Denies any injury she states that she wore her cam boot a few days and it seemed to help.  She is about to embark on another trip to [place].  She also goes on to say that her right foot for we took the sesamoid out is still painful.  She is also going on to say that she has pain with the use of her orthoses.  Objective: Vital signs are stable she is alert and oriented x 3 pulses are palpable.  There is no erythema edema cellulitis drainage or odor there is no ecchymosis.  She has tenderness on palpation of the proximal aspect of the first intermetatarsal space and with frontal plane range of motion in the same area.  Radiographs taken today do not demonstrate any type of osseous abnormalities.  No acute findings are noted.  No diastases between the first and second metatarsals.  Assessment: Lisfranc sprain first intermetatarsal space left.  Plan: Started on methylprednisolone  followed by Celebrex .  She will also make an appointment to see Lolita for a referral of her orthotics.  We also suggested power steps and a treks insoles.

## 2023-04-19 ENCOUNTER — Ambulatory Visit: Payer: 59 | Admitting: Podiatry

## 2023-04-29 ENCOUNTER — Other Ambulatory Visit: Payer: 59

## 2023-05-03 ENCOUNTER — Ambulatory Visit: Payer: 59 | Admitting: Podiatry

## 2023-05-27 ENCOUNTER — Ambulatory Visit: Payer: 59

## 2023-05-27 NOTE — Progress Notes (Signed)
 Refurbished orthotics / patient will owe $90 when in

## 2023-06-22 ENCOUNTER — Telehealth: Payer: Self-pay

## 2023-06-22 DIAGNOSIS — M79673 Pain in unspecified foot: Secondary | ICD-10-CM

## 2023-06-22 NOTE — Telephone Encounter (Signed)
 Refurbs are in she is billed $90

## 2023-08-27 ENCOUNTER — Other Ambulatory Visit: Payer: Self-pay | Admitting: Podiatry

## 2023-11-29 ENCOUNTER — Encounter: Payer: Self-pay | Admitting: Sports Medicine

## 2024-02-09 ENCOUNTER — Ambulatory Visit: Admitting: Podiatry

## 2024-02-09 ENCOUNTER — Ambulatory Visit

## 2024-02-09 DIAGNOSIS — M65971 Unspecified synovitis and tenosynovitis, right ankle and foot: Secondary | ICD-10-CM | POA: Diagnosis not present

## 2024-02-09 DIAGNOSIS — M65979 Unspecified synovitis and tenosynovitis, unspecified ankle and foot: Secondary | ICD-10-CM

## 2024-02-09 DIAGNOSIS — S93622D Sprain of tarsometatarsal ligament of left foot, subsequent encounter: Secondary | ICD-10-CM | POA: Diagnosis not present

## 2024-02-09 DIAGNOSIS — M65972 Unspecified synovitis and tenosynovitis, left ankle and foot: Secondary | ICD-10-CM

## 2024-02-09 MED ORDER — TRIAMCINOLONE ACETONIDE 40 MG/ML IJ SUSP
20.0000 mg | Freq: Once | INTRAMUSCULAR | Status: AC
  Administered 2024-02-09: 20 mg

## 2024-02-09 MED ORDER — CELECOXIB 200 MG PO CAPS: 200.0000 mg | ORAL_CAPSULE | Freq: Two times a day (BID) | ORAL | 5 refills | Status: DC

## 2024-02-09 MED ORDER — METHYLPREDNISOLONE 4 MG PO TBPK: ORAL_TABLET | ORAL | 0 refills | Status: DC

## 2024-02-09 NOTE — Progress Notes (Signed)
 She presents today chief complaint of pain to the dorsal lateral aspect of the bilateral foot stating the left seems to be the worst.  Denies any trauma to the foot.  Objective: Vital signs are stable alert oriented x 3.  Pulses are palpable.  Reproducible pain on palpation proximal fourth and metatarsal space left.  No pain on frontal plane range of motion.  Assessment: Intermetatarsal ligament sprain fourth fifth metatarsal left.  Plan: Discussed etiology pathology conservative surgical therapies at this point I injected proximally with 10 mg Kenalog  pentagrams Marcaine to the point of maximal tenderness.  I started her on methylprednisolone  to be followed by Celebrex .

## 2024-02-16 ENCOUNTER — Ambulatory Visit: Admitting: Podiatry

## 2024-03-01 ENCOUNTER — Ambulatory Visit: Admitting: Podiatry

## 2024-03-13 ENCOUNTER — Ambulatory Visit: Admitting: Podiatry

## 2024-03-13 ENCOUNTER — Encounter: Payer: Self-pay | Admitting: Podiatry

## 2024-03-13 DIAGNOSIS — S93622D Sprain of tarsometatarsal ligament of left foot, subsequent encounter: Secondary | ICD-10-CM | POA: Diagnosis not present

## 2024-03-13 NOTE — Progress Notes (Signed)
 She presents today for follow-up of her intermetatarsal sprain at the tarsometatarsal joint left foot.  She states that she thinks she is probably about 80% better and continues to improve regularly.  Continues to take her Celebrex  on a regular basis.  Objective: Vital signs are stable alert oriented x 3 no reproducible pain on palpation of the fourth intermetatarsal space or the third intermetatarsal space she does not have any pain on frontal plane range of motion inversion or even from of the forefoot.  Assessment: Improving midfoot sprain.  Plan: Follow-up with me on an as-needed basis continue good shoe gear and her Celebrex  until 100%.  If this worsens an MRI should be necessary

## 2024-04-19 ENCOUNTER — Ambulatory Visit: Admitting: Podiatry

## 2024-04-19 DIAGNOSIS — M722 Plantar fascial fibromatosis: Secondary | ICD-10-CM | POA: Diagnosis not present

## 2024-04-19 MED ORDER — TRIAMCINOLONE ACETONIDE 40 MG/ML IJ SUSP
20.0000 mg | Freq: Once | INTRAMUSCULAR | Status: AC
  Administered 2024-04-19: 20 mg

## 2024-04-19 MED ORDER — METHYLPREDNISOLONE 4 MG PO TBPK: ORAL_TABLET | ORAL | 0 refills | Status: AC

## 2024-04-23 NOTE — Progress Notes (Signed)
 She presents today for a chief concern of worsening pain to her left foot.  Pain on palpation and ambulation.  She states that the pain had somewhat improved to the lateral aspect of the foot and is not as bad at night says she is at least able to sleep.  Has had a lot of stretching and ice therapy with this she has been trying to rest the foot she has a trip coming up this Friday and should be walking walking quite a bit so she will know if there is anyway she can get another injection.  Objective: Vital signs are stable alert oriented x 3.  Pulses are palpable.  She has severe pain on palpation medial calcaneal tubercle of the left heel.  I believe the pain that she is feeling in the fourth fifth intermetatarsal space is most likely stress to the intermetatarsal ligament due to lateral compensatory syndrome.  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  Plan: Started her on methylprednisolone  and I injected the area today 10 mg Kenalog  5 mg Marcaine point maximal tenderness.  Tolerated procedure well.
